# Patient Record
Sex: Female | Born: 1970 | Race: Black or African American | Hispanic: No | Marital: Single | Smoking: Current every day smoker
Health system: Southern US, Community
[De-identification: ages and names within clinical notes are randomized; demographics above are authoritative.]

## PROBLEM LIST (undated history)

## (undated) DIAGNOSIS — D649 Anemia, unspecified: Secondary | ICD-10-CM

## (undated) DIAGNOSIS — B1920 Unspecified viral hepatitis C without hepatic coma: Secondary | ICD-10-CM

## (undated) DIAGNOSIS — L989 Disorder of the skin and subcutaneous tissue, unspecified: Secondary | ICD-10-CM

## (undated) DIAGNOSIS — A599 Trichomoniasis, unspecified: Secondary | ICD-10-CM

## (undated) DIAGNOSIS — N289 Disorder of kidney and ureter, unspecified: Secondary | ICD-10-CM

## (undated) DIAGNOSIS — N750 Cyst of Bartholin's gland: Secondary | ICD-10-CM

## (undated) DIAGNOSIS — K759 Inflammatory liver disease, unspecified: Secondary | ICD-10-CM

## (undated) HISTORY — PX: TUBAL LIGATION: SHX77

---

## 1998-01-19 ENCOUNTER — Inpatient Hospital Stay (HOSPITAL_COMMUNITY): Admission: AD | Admit: 1998-01-19 | Discharge: 1998-01-19 | Payer: Self-pay | Admitting: Obstetrics

## 1998-08-22 ENCOUNTER — Inpatient Hospital Stay (HOSPITAL_COMMUNITY): Admission: AD | Admit: 1998-08-22 | Discharge: 1998-08-25 | Payer: Self-pay | Admitting: *Deleted

## 1998-09-15 ENCOUNTER — Emergency Department (HOSPITAL_COMMUNITY): Admission: EM | Admit: 1998-09-15 | Discharge: 1998-09-15 | Payer: Self-pay | Admitting: Internal Medicine

## 1999-08-02 ENCOUNTER — Emergency Department (HOSPITAL_COMMUNITY): Admission: EM | Admit: 1999-08-02 | Discharge: 1999-08-03 | Payer: Self-pay | Admitting: Emergency Medicine

## 2000-05-19 ENCOUNTER — Emergency Department (HOSPITAL_COMMUNITY): Admission: EM | Admit: 2000-05-19 | Discharge: 2000-05-19 | Payer: Self-pay | Admitting: Internal Medicine

## 2001-07-13 ENCOUNTER — Emergency Department (HOSPITAL_COMMUNITY): Admission: EM | Admit: 2001-07-13 | Discharge: 2001-07-14 | Payer: Self-pay

## 2002-06-23 ENCOUNTER — Inpatient Hospital Stay (HOSPITAL_COMMUNITY): Admission: AD | Admit: 2002-06-23 | Discharge: 2002-06-26 | Payer: Self-pay | Admitting: Obstetrics

## 2003-03-13 ENCOUNTER — Emergency Department (HOSPITAL_COMMUNITY): Admission: EM | Admit: 2003-03-13 | Discharge: 2003-03-14 | Payer: Self-pay | Admitting: Emergency Medicine

## 2003-03-14 ENCOUNTER — Encounter: Payer: Self-pay | Admitting: Emergency Medicine

## 2004-08-17 ENCOUNTER — Inpatient Hospital Stay (HOSPITAL_COMMUNITY): Admission: AD | Admit: 2004-08-17 | Discharge: 2004-08-17 | Payer: Self-pay | Admitting: *Deleted

## 2007-10-11 ENCOUNTER — Inpatient Hospital Stay (HOSPITAL_COMMUNITY): Admission: AD | Admit: 2007-10-11 | Discharge: 2007-10-11 | Payer: Self-pay | Admitting: Obstetrics & Gynecology

## 2008-02-29 ENCOUNTER — Ambulatory Visit: Payer: Self-pay | Admitting: Obstetrics and Gynecology

## 2008-02-29 ENCOUNTER — Encounter: Payer: Self-pay | Admitting: Obstetrics and Gynecology

## 2008-02-29 ENCOUNTER — Inpatient Hospital Stay (HOSPITAL_COMMUNITY): Admission: AD | Admit: 2008-02-29 | Discharge: 2008-03-03 | Payer: Self-pay | Admitting: Obstetrics and Gynecology

## 2010-11-30 NOTE — Op Note (Signed)
NAME:  Isabella Powell, Isabella Powell NO.:  [id number]   MEDICAL RECORD NO.:  [id number]          PATIENT TYPE:  INP   LOCATION:                                FACILITY:  [facility]   PHYSICIAN:  Tilda Burrow, M.D. DATE OF BIRTH:  1971-04-25   DATE OF PROCEDURE:  DATE OF DISCHARGE:                               OPERATIVE REPORT   PREOP:  Pregnancy 37 weeks, twin gestation, spontaneous rupture  membranes, early labor, intrauterine growth retardation Baby B, breech  presentation, baby B, elective permanent sterilization requested.   POSTOP:  Pregnancy 37 weeks, twin gestation, spontaneous rupture  membranes, early labor, intrauterine growth retardation Baby B, breech  presentation, baby B, also nuchal cord x1 baby B, elective permanent  sterilization requested.   PROCEDURE:  Primary low-transverse cervical cesarean section and  bilateral partial salpingectomy.   SURGEON:  Tilda Burrow, MD   ASSISTANT:  None.   ANESTHESIA:  Epidural, spinal unsuccessful, repeated by epidural.   COMPLICATIONS:  None.   FINDINGS:  A healthy female infant, Apgars __________.  Healthy female  infant baby B, Apgars so and so with nuchal cord x1.  Clear amniotic  fluid bilaterally.   INDICATIONS:  A 40 year old gravida 4, para 3-0-0-3 known twin A  gestation with ultrasound confirmed IUGR baby B presenting in early  labor with membrane rupture.  Ultrasound showing 29% growth of  discrepancy, breech baby B.  The patient presents and after discussion  of options, confirms both ambivalence regarding vaginal delivery and  desire for permanent sterilization and desire for expedited delivery.   DETAILS OF PROCEDURE:  The patient was taken operating to the room,  prepped and draped in the usual fashion for lower abdominal surgery.  After the epidural analgesic defect prior efforts of spinal then  incomplete, and so epidural was placed and used to achieve optimal  analgesia.  A Pfannenstiel incision  was repeated easily.  The patient  had minimal subcu fat.  Peritoneal cavity was entered in the midline.  Bladder flap developed on lower uterine segment.  Transverse uterine  incision performed at the level of the inferior aspects of the anterior  placenta of baby A and fetal vertex guided into the incision and  delivered by fundal pressure.  There was a generous amount of blood loss  associated with varicosities in the myometrium adjacent at the site of  the incision.  The baby was passed to the awaiting pediatrician, Dr.  Joana Reamer whose resuscitation involved is documented elsewhere.   Baby B could be found in the upper portion of the uterus and fetal  vertex was identified, guided into the incision, and delivered by vertex  presentation after membranes ruptured.  There was a nuchal cord x1 which  had reslipped over the head delivered the baby.  The placenta was then  delivered with no separate placentas noted.  The placenta of the Baby B  was high and the fundus placenta of baby A was anterior on the anterior  reaching down to the uterine incision.   The uterus was grasped with ring forceps in 4 sites, closed  with running  locking single-layer closure.  Bladder flap was approximated using 2-0  chromic running fashion.   Tubal ligation was performed by placing a Filshie clip on the mid  portion of each tube with satisfactory surgical positioning.   The patient again verbally confirmed desire for permanent sterilization,  both the start of the case and immediately before procedure was  performed.   The anterior peritoneum was closed using running 2-0 chromic.  The  fascia closed using running 0 Vicryl, subcu fatty tissues that were  minimal in thickness were loosely reapproximated with 3 interrupted  sutures of 3-0 and 2-0 plain and staple closure of skin resulted in good  cosmetic closure of the incision.  EBL was 1000 mL associated almost 6  once I believe with the initial  uterine incision.  The patient then went  to recovery room in stable condition.  Babies are in the nursery.  See  their notes noted elsewhere.      Tilda Burrow, M.D.  Electronically Signed     JVF/MEDQ  D:  02/29/2008  T:  03/01/2008  Job:  [id number]

## 2010-12-03 NOTE — Discharge Summary (Signed)
   NAME:  Isabella Powell, Isabella Powell                        ACCOUNT NO.:  [id number]   MEDICAL RECORD NO.:  [id number]                   PATIENT TYPE:  INP   LOCATION:  9133                                 FACILITY:  [facility]   PHYSICIAN:  Kathreen Cosier, M.D.           DATE OF BIRTH:  1970-11-04   DATE OF ADMISSION:  06/23/2002  DATE OF DISCHARGE:  06/26/2002                                 DISCHARGE SUMMARY   HISTORY OF PRESENT ILLNESS:  The patient is a 40 year old Gravida V, Para  III, 0/2/3 who's last menstrual period was June 03, 2002. She was  admitted because of lower abdominal pain with right labia that was swollen  since her pregnancy in 2000. On examination, her temperature was 100.2. On  examination had bilateral lower abdominal tenderness with no rebound. She  was admitted and placed on IV Rocephin and her hemoglobin was 12.9. White  count 11.2. Platelets 238,000. Urine pregnancy test was negative. Urinalysis  showed moderate leukocyte esterase. Her GC was positive. She rapidly  defervesced and by June 26, 2002 after being treated with Rocephin  daily, she was afebrile for greater than 24 hours. Abdomen was soft and she  was discharged home on Ampicillin 500 mg by mouth every six hours. She is to  see me in two weeks.   DISCHARGE DIAGNOSES:  Status post hospitalization for gonorrhea.                                               Kathreen Cosier, M.D.    BAM/MEDQ  D:  08/15/2002  T:  08/15/2002  Job:  [id number]

## 2010-12-03 NOTE — Discharge Summary (Signed)
Isabella Powell, Isabella Powell              ACCOUNT NO.:  [id number]   MEDICAL RECORD NO.:  [id number]          PATIENT TYPE:  INP   LOCATION:  9134                          [facility]ACILITY:  [facility]   PHYSICIAN:  Tilda Burrow, M.D. DATE OF BIRTH:  03/30/1971   DATE OF ADMISSION:  02/29/2008  DATE OF DISCHARGE:  03/03/2008                               DISCHARGE SUMMARY   ADMITTING DIAGNOSES:  1. Pregnancy, 37 weeks 4 days.  2. Premature rupture membranes without labor.  3. Twin gestation.  4. Cocaine abuse.  5. Discordant growth of twins, breech baby B.   DISCHARGE DIAGNOSES:  1. Pregnancy at 37 weeks, delivered.  2. Twin gestation.  3. Cocaine abuse.   HISTORY OF PRESENT ILLNESS:  This 40 year old gravida 4, para 3-0-0-3  with twins of 37 weeks with no prenatal care over the last 2 months with  acknowledged cocaine use is admitted after pregnancy course followed as  much as the patient would keep her appointments through the [facility]  Cl[facility] here at [facility].  She presents on the  evening of February 29, 2008, with membrane rupture.  She was counseled  over the possibility of vaginal delivery.  The review of record shows  29% growth discordance twin baby A and B.  The patient also desires  tubal ligation, cervix of 3 cm,  20%, -2 posterior vertex presentation  of baby A.  After discussion of risks, benefits and counseling, the  patient desires to proceed with cesarean section and simultaneous tubal  ligation.   HOSPITAL COURSE:  The patient underwent requested C-section tubal  ligation, delivering a 5-pound 10-ounce (57 female baby A) and a 1827  grams (4-pound 1-ounce) baby B female.  Postoperatively, the patient did  well.  Her urine drug screen returned positive for cocaine even though  the patient has always denied use in the recent past.  Social work  consult was performed and Child Protective Case was initiated.  Postoperatively, the patient had stable vital signs  with blood pressures  114/77, pulse 80, and respirations 20.  She was counseled by social work  for whom the patient claimed that she did not have an ongoing substance  abuse problem and does not need any resources for discontinuing use of  cocaine.  She allegedly still claims not to use cocaine during the  pregnancy, but is due to other people in her home using the drug by  smoking it.  The patient nonetheless discharged home in stable  condition, _patient and family___ showing interest in the babies with  routine postsurgical instructions review.      Tilda Burrow, M.D.  Electronically Signed     JVF/MEDQ  D:  03/30/2008  T:  03/31/2008  Job:  [id number]

## 2010-12-03 NOTE — Discharge Summary (Signed)
NAMEMELYSA, Isabella Powell              ACCOUNT NO.:  [id number]   MEDICAL RECORD NO.:  [id number]          PATIENT TYPE:  INP   LOCATION:  9134                          [facility]ACILITY:  [facility]   PHYSICIAN:  Tanya S. Shawnie Pons, M.D.   DATE OF BIRTH:  March 20, 1971   DATE OF ADMISSION:  02/29/2008  DATE OF DISCHARGE:  03/03/2008                               DISCHARGE SUMMARY   ADMITTING DIAGNOSES:  84. A 40 year old female G7, P3-0-3-3 at 55 and 4 weeks' gestation with      spontaneous rupture of membranes.  2. Twin gestation with intrauterine growth retardation of baby B, and      breech presentation of baby B.  3. Elective permanent sterilization requested.   DISCHARGE DIAGNOSES:  55. A 40 year old female G7, P3-0-3-3 at 65 and 4 weeks' gestation with      spontaneous rupture of membranes.  2. Primary low transverse cesarean section with resulting twin      deliveries.  3. Bilateral partial salpingectomy.   DISCHARGE MEDICATIONS:  1. Percocet 5/325, take 1-2 every 6 hours as needed for pain.  2. Ibuprofen 600 mg, take every 6 hours as needed for pain.  3. Colace 100 mg, take b.i.d. for constipation.  4. Continue prenatal vitamins.   LABORATORY DATA:  White blood cell count 6.7, hemoglobin 11.7,  hematocrit 33.7, platelets 334.  RPR is nonreactive.  Rubella was  immune.  Hep B surface antigen negative.   BRIEF HOSPITAL COURSE:  Please see operative report of February 29, 2008,  dictated by Dr. Emelda Fear.   DISCHARGE INSTRUCTIONS:  The patient was given routine postpartum  discharge instructions and medications, and she was advised to follow up  with her primary OB in 4-6 weeks for regular postpartum check.      Helane Rima, MD  Electronically Signed     ______________________________  Shelbie Proctor. Shawnie Pons, M.D.    EW/MEDQ  D:  03/29/2008  T:  03/29/2008  Job:  [id number]

## 2011-04-11 LAB — RAPID URINE DRUG SCREEN, HOSP PERFORMED
Barbiturates: NOT DETECTED
Benzodiazepines: NOT DETECTED
Cocaine: POSITIVE — AB
Opiates: NOT DETECTED

## 2011-04-11 LAB — URINALYSIS, ROUTINE W REFLEX MICROSCOPIC
Glucose, UA: NEGATIVE
Protein, ur: NEGATIVE
Specific Gravity, Urine: >1.03 — ABNORMAL HIGH
pH: 5.5

## 2011-04-11 LAB — DIFFERENTIAL
Basophils Relative: 0
Eosinophils Absolute: 0.2
Lymphs Abs: 1.3
Neutrophils Relative %: 67

## 2011-04-11 LAB — RPR: RPR Ser Ql: NONREACTIVE

## 2011-04-11 LAB — HEPATITIS B SURFACE ANTIGEN: Hepatitis B Surface Ag: NEGATIVE

## 2011-04-11 LAB — CBC
MCV: 99.1
Platelets: 331
WBC: 7.4

## 2011-04-11 LAB — GC/CHLAMYDIA PROBE AMP, GENITAL: GC Probe Amp, Genital: NEGATIVE

## 2011-04-11 LAB — URINE CULTURE

## 2011-04-11 LAB — TYPE AND SCREEN
ABO/RH(D): O POS
Antibody Screen: NEGATIVE

## 2011-04-15 LAB — RAPID URINE DRUG SCREEN, HOSP PERFORMED
Amphetamines: NOT DETECTED
Barbiturates: NOT DETECTED
Tetrahydrocannabinol: NOT DETECTED

## 2011-04-15 LAB — RUBELLA SCREEN: Rubella: 130.5 — ABNORMAL HIGH

## 2011-04-15 LAB — CBC
Hemoglobin: 11.7 — ABNORMAL LOW
RBC: 3.14 — ABNORMAL LOW

## 2011-04-15 LAB — TYPE AND SCREEN: Antibody Screen: NEGATIVE

## 2011-04-15 LAB — DIFFERENTIAL
Lymphocytes Relative: 22
Lymphs Abs: 1.5
Monocytes Absolute: 0.8
Monocytes Relative: 12
Neutro Abs: 4.3

## 2011-04-15 LAB — RPR: RPR Ser Ql: NONREACTIVE

## 2012-06-26 ENCOUNTER — Inpatient Hospital Stay (HOSPITAL_COMMUNITY)
Admission: AD | Admit: 2012-06-26 | Discharge: 2012-06-26 | Disposition: A | Discharge status: Home / Self Care | Payer: Self-pay | Source: Ambulatory Visit | Attending: Family Medicine | Admitting: Family Medicine

## 2012-06-26 ENCOUNTER — Encounter (HOSPITAL_COMMUNITY): Payer: Self-pay

## 2012-06-26 DIAGNOSIS — N750 Cyst of Bartholin's gland: Secondary | ICD-10-CM | POA: Insufficient documentation

## 2012-06-26 DIAGNOSIS — N949 Unspecified condition associated with female genital organs and menstrual cycle: Secondary | ICD-10-CM | POA: Insufficient documentation

## 2012-06-26 HISTORY — DX: Disorder of the skin and subcutaneous tissue, unspecified: L98.9

## 2012-06-26 HISTORY — DX: Cyst of Bartholin's gland: N75.0

## 2012-06-26 HISTORY — DX: Trichomoniasis, unspecified: A59.9

## 2012-06-26 MED ORDER — OXYCODONE-ACETAMINOPHEN 5-325 MG PO TABS
2.0000 | ORAL_TABLET | Freq: Once | ORAL | Status: AC
  Administered 2012-06-26: 2 via ORAL
  Filled 2012-06-26: qty 2

## 2012-06-26 MED ORDER — OXYCODONE-ACETAMINOPHEN 5-325 MG PO TABS: 1.0000 | ORAL_TABLET | Freq: Four times a day (QID) | ORAL | Status: DC | PRN

## 2012-06-26 MED ORDER — ONDANSETRON 8 MG PO TBDP
8.0000 mg | ORAL_TABLET | Freq: Once | ORAL | Status: AC
  Administered 2012-06-26: 8 mg via ORAL
  Filled 2012-06-26: qty 1

## 2012-06-26 MED ORDER — HYDROMORPHONE HCL PF 1 MG/ML IJ SOLN
1.0000 mg | Freq: Once | INTRAMUSCULAR | Status: AC
  Administered 2012-06-26: 1 mg via INTRAMUSCULAR
  Filled 2012-06-26: qty 1

## 2012-06-26 NOTE — MAU Note (Signed)
Patient is in with c/o intense pain that she thinks its bartholian cyst. She states that she had it lanced about 3 years ago. She states that the pain started 3 days ago but today the pain is intense (throbbing pain with any movement).

## 2012-06-26 NOTE — ED Provider Notes (Signed)
History     CSN: [id number]  Arrival date and time: 06/26/12 1017   First Provider Initiated Contact with Patient 06/26/12 1158      Chief Complaint  Patient presents with  . Bartholin's Cyst   HPI   Ms. Isabella Powell is a 41 y.o. [id number] who presents today in a significant amount of pain. The pain has been present for 3 days. She describes the pain as the worst she has ever felt. This pain is similar to her previous bartholin's cysts. She has had one previous cyst that required drainage. She has also had numerous that will come and go spontaneously, the last of which occurred in August of this year. She soaked in a warm bath last night and felt that the pain was worse afterwards. She has been taking her boyfriend's percocet for her pain the last few days with some relief. She denies abdominal or pelvic pain, VB or discharge. She denies recent fever.   OB History    Grav Para Term Preterm Abortions TAB SAB Ect Mult Living   7 5 4 1 2 1 1   5       Past Medical History  Diagnosis Date  . Bartholin cyst     Past Surgical History  Procedure Date  . Cesarean section   . Tubal ligation     bilateral    History reviewed. No pertinent family history.  History  Substance Use Topics  . Smoking status: Current Every Day Smoker    Types: Cigarettes  . Smokeless tobacco: Not on file  . Alcohol Use: Yes    Allergies: No Known Allergies  No prescriptions prior to admission    ROS  All ROS negative unless otherwise stated in HPI  Physical Exam   Blood pressure 108/69, pulse 78, temperature 98.9 F (37.2 C), temperature source Oral, resp. rate 18, last menstrual period 06/20/2012, SpO2 98.00%.  Physical Exam  Constitutional: She is oriented to person, place, and time. She appears well-developed and well-nourished. No distress.  HENT:  Head: Normocephalic.  Cardiovascular: Normal rate.   Respiratory: Effort normal.  GI: Soft. She exhibits no distension. There is no  tenderness.  Genitourinary: There is tenderness and lesion (significantly inflammed batholin's cyst) on the right labia.  Neurological: She is alert and oriented to person, place, and time.  Skin: Skin is warm and dry. 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 No erythema.  Psychiatric: She has a normal mood and affect.    MAU Course  Procedures  Bartholin Cyst I&D and Ward Catheter Placement Enlarged abscess palpated in front of the hymenal ring around 7 o' clock.  Written informed consent was obtained.  Discussed complications and possible outcomes of procedure including recurrence of cyst, scarring leading to infecton, bleeding, dyspareunia, distortion of anatomy.  Patient was examined in the dorsal lithotomy position and mass was identified.  The area was prepped with Iodine and draped in a sterile manner. 1% Lidocaine (3 ml) was then used to infiltrate area on top of the cyst, behind the hymenal ring.  A 7 mm incision was made using a sterile scapel. Upon palpation of the mass, a copious amount of bloody purulent and odorless drainage was expressed through the incision. Samples of the drainage were sent for cultures. A hemostat was used to break up loculations, which resulted in expression of a small amount of additional bloody drainage.  The open cyst was then irrigated with normal saline and soon after was no longer draining. A 3cm x  3cm firm mass was still palpable following the procedure. A Word catheter was placed. 1.5 ml of sterile water was used to inflate the catheter balloon.  The end of the catheter was tucked into the vagina.  Patient tolerated the procedure well, reported feeling " a lot better." - Percocet was given prn pain.   She was told to call to be examined if she experiences increasing swelling, pain, vaginal discharge, or fever.  - She was instructed to wear a peripad to absorb discharge, and to maintain pelvic rest while the Word catheter is in place.  -The catheter will be left in place  at least until follow-up in GYN clinic later this week to promote formation of an epithelialized tract for permanent drainage of glandular secretions.  - She will need follow-up in the [facility] GYN clinic preferably by the end of the week to re-evaluate the bartholin's cyst.     MDM Patient given Percocet for pain while in MAU. Unable to do exam prior to pain medication administration.  Assessment and Plan  A: Drainage of large Bartholin's cyst vs. abscess with persistent firm mass. Possible biopsy recommended in the future if the mass does not resolve in the next 2-3 weeks.   P: I&D of Bartholin's cyst in MAU today. Word catheter placed. Rx given for Percocet Patient will be called from clinic to follow-up by the end of the week for re-evaluation of bartholin's cyst and removal of word catheter Patient told to return to MAU if her condition were to change or worsen or if she were to develop a fever greater than 100.81F   Freddi Starr, PA-C 06/26/2012 4:54 PM    Clemetine Marker C  Home Medication Instructions [id number]   Printed on:06/26/12 1631  Medication Information                    oxyCODONE-acetaminophen (ROXICET) 5-325 MG per tablet Take 1-2 tablets by mouth every 6 (six) hours as needed for pain.             Freddi Starr, PA-C 06/26/12 1949

## 2012-06-26 NOTE — ED Provider Notes (Signed)
Chart reviewed and agree with management and plan.  

## 2012-06-26 NOTE — MAU Note (Signed)
Patient states she has a history of bartholin cyst. States she started having one on the right side about 3 days ago. Extremely painful. Denies vaginal bleeding or discharge.

## 2012-06-28 ENCOUNTER — Encounter: Payer: Self-pay | Admitting: Obstetrics & Gynecology

## 2012-06-29 LAB — WOUND CULTURE: Culture: NO GROWTH

## 2013-01-12 ENCOUNTER — Encounter (HOSPITAL_COMMUNITY): Payer: Self-pay | Admitting: Emergency Medicine

## 2013-01-12 ENCOUNTER — Emergency Department (INDEPENDENT_AMBULATORY_CARE_PROVIDER_SITE_OTHER)
Admission: EM | Admit: 2013-01-12 | Discharge: 2013-01-12 | Disposition: A | Discharge status: Home / Self Care | Payer: Self-pay | Source: Home / Self Care

## 2013-01-12 DIAGNOSIS — K0500 Acute gingivitis, plaque induced: Secondary | ICD-10-CM

## 2013-01-12 DIAGNOSIS — R221 Localized swelling, mass and lump, neck: Secondary | ICD-10-CM

## 2013-01-12 DIAGNOSIS — K029 Dental caries, unspecified: Secondary | ICD-10-CM

## 2013-01-12 DIAGNOSIS — R220 Localized swelling, mass and lump, head: Secondary | ICD-10-CM

## 2013-01-12 DIAGNOSIS — K089 Disorder of teeth and supporting structures, unspecified: Secondary | ICD-10-CM

## 2013-01-12 DIAGNOSIS — K0889 Other specified disorders of teeth and supporting structures: Secondary | ICD-10-CM

## 2013-01-12 MED ORDER — AMOXICILLIN 500 MG PO CAPS: ORAL_CAPSULE | ORAL | Status: DC

## 2013-01-12 MED ORDER — OXYCODONE-ACETAMINOPHEN 5-325 MG PO TABS: 1.0000 | ORAL_TABLET | ORAL | Status: DC | PRN

## 2013-01-12 MED ORDER — KETOROLAC TROMETHAMINE 30 MG/ML IJ SOLN
INTRAMUSCULAR | Status: AC
  Filled 2013-01-12: qty 1

## 2013-01-12 MED ORDER — KETOROLAC TROMETHAMINE 30 MG/ML IJ SOLN
30.0000 mg | Freq: Once | INTRAMUSCULAR | Status: AC
  Administered 2013-01-12: 30 mg via INTRAMUSCULAR

## 2013-01-12 NOTE — ED Notes (Signed)
Pt c/o dental pain/abscess onset 3 days... sxs include: Left side of face, bottom, swollen, fevers, v/n and left ear pain... Taking ibup w/no relief... She is alert w/no signs of acute distress.

## 2013-01-12 NOTE — ED Provider Notes (Signed)
Medical screening examination/treatment/procedure(s) were performed by non-physician practitioner and as supervising physician I was immediately available for consultation/collaboration.  Leslee Home, M.D.  Reuben Likes, MD 01/12/13 [phone]

## 2013-01-12 NOTE — ED Provider Notes (Signed)
History    CSN: [id number] Arrival date & time 01/12/13  1524  First MD Initiated Contact with Patient 01/12/13 1700     Chief Complaint  Patient presents with  . Dental Pain   (Consider location/radiation/quality/duration/timing/severity/associated sxs/prior Treatment) HPI Comments: 42 year old female presents with left facial pain for 2-3 days. She is unsure she actually has a toothache. She stated that 3 days ago she had temperature of 103. Denies sore throat but does state that she is beginning to get an earache on the left side.  Past Medical History  Diagnosis Date  . Bartholin cyst   . Psoriasis-like skin disease   . Trichomonas     patient states that many years ago   Past Surgical History  Procedure Laterality Date  . Cesarean section    . Tubal ligation      bilateral   No family history on file. History  Substance Use Topics  . Smoking status: Current Every Day Smoker    Types: Cigarettes  . Smokeless tobacco: Not on file  . Alcohol Use: Yes   OB History   Grav Para Term Preterm Abortions TAB SAB Ect Mult Living   7 5 4 1 2 1 1   5      Review of Systems  Constitutional: Positive for fever and activity change.  HENT: Positive for facial swelling. Negative for congestion, rhinorrhea, mouth sores, neck pain, neck stiffness and ear discharge.   Respiratory: Negative.   Gastrointestinal: Positive for nausea.  Skin: Negative.     Allergies  Review of patient's allergies indicates no known allergies.  Home Medications   Current Outpatient Rx  Name  Route  Sig  Dispense  Refill  . amoxicillin (AMOXIL) 500 MG capsule      1000 mg BID for 7 days   28 capsule   0   . oxyCODONE-acetaminophen (PERCOCET/ROXICET) 5-325 MG per tablet   Oral   Take 1 tablet by mouth every 4 (four) hours as needed for pain.   15 tablet   0   . oxyCODONE-acetaminophen (ROXICET) 5-325 MG per tablet   Oral   Take 1-2 tablets by mouth every 6 (six) hours as needed for  pain.   30 tablet   0    BP 111/61  Pulse 63  Temp(Src) 98.5 F (36.9 C) (Oral)  Resp 17  SpO2 100%  LMP 01/12/2013 Physical Exam  Vitals reviewed. Constitutional: She is oriented to person, place, and time. She appears well-developed and well-nourished.  HENT:  Bilateral TMs are normal. Oropharynx with minor erythema and PND. Some darkening of the tongue. "She smokes " Very poor dentition. There are large craters and multiple caries to the left lower molars. Many teeth are worn down to the none the level of the gingiva. There is erythema and swelling to the left lower gingiva. There is also swelling opposite the left mandible.  Neck: Normal range of motion. Neck supple.  Cardiovascular: Normal rate.   Pulmonary/Chest: Effort normal.  Lymphadenopathy:    She has no cervical adenopathy.  Neurological: She is alert and oriented to person, place, and time. She exhibits normal muscle tone.  Skin: Skin is warm and dry.  Psychiatric: She has a normal mood and affect.    ED Course  Procedures (including critical care time) Labs Reviewed - No data to display No results found. 1. Toothache   2. Caries involving multiple surfaces of tooth   3. Left facial swelling   4. Acute gingivitis  MDM  Amoxicillin 1000 mg twice a day for 7 days Toradol 30 mg IM now Percocet 5 mg one every 4 hours when necessary pain #15 Apply heat to the area of swelling and pain See her dentist on Monday as planned.  Hayden Rasmussen, NP 01/12/13 1733

## 2013-05-21 ENCOUNTER — Encounter: Payer: Self-pay | Admitting: Obstetrics

## 2013-05-21 ENCOUNTER — Ambulatory Visit: Payer: Self-pay | Admitting: Obstetrics

## 2013-06-04 ENCOUNTER — Ambulatory Visit: Payer: Self-pay | Admitting: Obstetrics

## 2013-06-17 ENCOUNTER — Ambulatory Visit: Payer: Medicaid Other | Admitting: Obstetrics

## 2013-09-30 ENCOUNTER — Ambulatory Visit: Payer: Medicaid Other | Admitting: Obstetrics

## 2013-10-09 ENCOUNTER — Ambulatory Visit: Payer: Medicaid Other | Admitting: Obstetrics

## 2013-11-14 ENCOUNTER — Ambulatory Visit: Payer: Medicaid Other | Admitting: Obstetrics

## 2013-11-15 ENCOUNTER — Ambulatory Visit: Payer: Medicaid Other | Admitting: Obstetrics

## 2013-11-20 ENCOUNTER — Ambulatory Visit: Payer: Medicaid Other | Admitting: Obstetrics

## 2014-02-03 ENCOUNTER — Ambulatory Visit: Payer: Medicaid Other | Admitting: Obstetrics

## 2014-02-04 ENCOUNTER — Ambulatory Visit: Payer: Medicaid Other | Admitting: Obstetrics

## 2014-05-02 ENCOUNTER — Inpatient Hospital Stay (HOSPITAL_COMMUNITY)
Admission: AD | Admit: 2014-05-02 | Discharge: 2014-05-02 | Disposition: A | Discharge status: Home / Self Care | Payer: Medicaid Other | Source: Ambulatory Visit | Attending: Family Medicine | Admitting: Family Medicine

## 2014-05-02 ENCOUNTER — Encounter (HOSPITAL_COMMUNITY): Payer: Self-pay | Admitting: *Deleted

## 2014-05-02 DIAGNOSIS — F17210 Nicotine dependence, cigarettes, uncomplicated: Secondary | ICD-10-CM | POA: Diagnosis not present

## 2014-05-02 DIAGNOSIS — N750 Cyst of Bartholin's gland: Secondary | ICD-10-CM | POA: Diagnosis not present

## 2014-05-02 LAB — POCT PREGNANCY, URINE: PREG TEST UR: NEGATIVE

## 2014-05-02 MED ORDER — LIDOCAINE HCL 2 % EX GEL
1.0000 "application " | Freq: Once | CUTANEOUS | Status: AC
  Administered 2014-05-02: 1 via TOPICAL
  Filled 2014-05-02: qty 5

## 2014-05-02 MED ORDER — OXYCODONE-ACETAMINOPHEN 5-325 MG PO TABS: 1.0000 | ORAL_TABLET | ORAL | Status: DC | PRN

## 2014-05-02 MED ORDER — OXYCODONE-ACETAMINOPHEN 5-325 MG PO TABS
1.0000 | ORAL_TABLET | Freq: Once | ORAL | Status: AC
Start: 2014-05-02 — End: 2014-05-02
  Administered 2014-05-02: 1 via ORAL
  Filled 2014-05-02: qty 1

## 2014-05-02 MED ORDER — LIDOCAINE HCL (PF) 1 % IJ SOLN
30.0000 mL | Freq: Once | INTRAMUSCULAR | Status: AC
  Administered 2014-05-02: 30 mL via INTRADERMAL

## 2014-05-02 NOTE — Discharge Instructions (Signed)
Bartholin's Cyst or Abscess °Bartholin's glands are small glands located within the folds of skin (labia) along the sides of the lower opening of the vagina (birth canal). A cyst may develop when the duct of the gland becomes blocked. When this happens, fluid that accumulates within the cyst can become infected. This is known as an abscess. The Bartholin gland produces a mucous fluid to lubricate the outside of the vagina during sexual intercourse. °SYMPTOMS  °· Patients with a small cyst may not have any symptoms. °· Mild discomfort to severe pain depending on the size of the cyst and if it is infected (abscess). °· Pain, redness, and swelling around the lower opening of the vagina. °· Painful intercourse. °· Pressure in the perineal area. °· Swelling of the lips of the vagina (labia). °· The cyst or abscess can be on one side or both sides of the vagina. °DIAGNOSIS  °· A large swelling is seen in the lower vagina area by your caregiver. °· Painful to touch. °· Redness and pain, if it is an abscess. °TREATMENT  °· Sometimes the cyst will go away on its own. °· Apply warm wet compresses to the area or take hot sitz baths several times a day. °· An incision to drain the cyst or abscess with local anesthesia. °· Culture the pus, if it is an abscess. °· Antibiotic treatment, if it is an abscess. °· Cut open the gland and suture the edges to make the opening of the gland bigger (marsupialization). °· Remove the whole gland if the cyst or abscess returns. °PREVENTION  °· Practice good hygiene. °· Clean the vaginal area with a mild soap and soft cloth when bathing. °· Do not rub hard in the vaginal area when bathing. °· Protect the crotch area with a padded cushion if you take long bike rides or ride horses. °· Be sure you are well lubricated when you have sexual intercourse. °HOME CARE INSTRUCTIONS  °· If your cyst or abscess was opened, a small piece of gauze, or a drain, may have been placed in the wound to allow  drainage. Do not remove this gauze or drain unless directed by your caregiver. °· Wear feminine pads, not tampons, as needed for any drainage or bleeding. °· If antibiotics were prescribed, take them exactly as directed. Finish the entire course. °· Only take over-the-counter or prescription medicines for pain, discomfort, or fever as directed by your caregiver. °SEEK IMMEDIATE MEDICAL CARE IF:  °· You have an increase in pain, redness, swelling, or drainage. °· You have bleeding from the wound which results in the use of more than the number of pads suggested by your caregiver in 24 hours. °· You have chills. °· You have a fever. °· You develop any new problems (symptoms) or aggravation of your existing condition. °MAKE SURE YOU:  °· Understand these instructions. °· Will watch your condition. °· Will get help right away if you are not doing well or get worse. °Document Released: 07/04/2005 Document Revised: 09/26/2011 Document Reviewed: 02/20/2008 °[employer]® Patient Information ©2015 [employer]. This information is not intended to replace advice given to you by your health care provider. Make sure you discuss any questions you have with your health care provider. ° °

## 2014-05-02 NOTE — MAU Provider Note (Signed)
History     CSN: [id number]  Arrival date and time: 05/02/14 1601   None     Chief Complaint  Patient presents with  . Bartholin's Cyst   HPI  Ms. Isabella Powell is a 43 y.o.female who presents with a bartholin's Cyst abscess  that she has had for over a month. It started draining on its own initially, however has grown bigger over the last few days.  She does not have a regular dr. Claiborne Powell she see's. It has become difficult for the patient to get comfortable due to the pain the cyst is causing.     OB History   Grav Para Term Preterm Abortions TAB SAB Ect Mult Living   7 5 4 1 2 1 1   5       Past Medical History  Diagnosis Date  . Bartholin cyst   . Psoriasis-like skin disease   . Trichomonas     patient states that many years ago    Past Surgical History  Procedure Laterality Date  . Cesarean section    . Tubal ligation      bilateral    No family history on file.  History  Substance Use Topics  . Smoking status: Current Every Day Smoker -- 0.50 packs/day    Types: Cigarettes  . Smokeless tobacco: Never Used  . Alcohol Use: Yes     Comment: occassionally    Allergies: No Known Allergies  Prescriptions prior to admission  Medication Sig Dispense Refill  . amoxicillin (AMOXIL) 500 MG capsule 1000 mg BID for 7 days  28 capsule  0  . oxyCODONE-acetaminophen (PERCOCET/ROXICET) 5-325 MG per tablet Take 1 tablet by mouth every 4 (four) hours as needed for pain.  15 tablet  0  . oxyCODONE-acetaminophen (ROXICET) 5-325 MG per tablet Take 1-2 tablets by mouth every 6 (six) hours as needed for pain.  30 tablet  0   Results for orders placed during the hospital encounter of 05/02/14 (from the past 48 hour(s))  POCT PREGNANCY, URINE     Status: None   Collection Time    05/02/14  8:52 PM      Result Value Ref Range   Preg Test, Ur NEGATIVE  NEGATIVE   Comment:            THE SENSITIVITY OF THIS     METHODOLOGY IS >24 mIU/mL    Review of Systems   Constitutional: Negative for fever and chills.  Genitourinary: Negative for dysuria, urgency, frequency and hematuria.   Physical Exam   Blood pressure 113/56, pulse 85, temperature 99.5 F (37.5 C), resp. rate 16, height 5' 7.5" (1.715 m), weight 53.978 kg (119 lb), last menstrual period 04/02/2014, SpO2 100.00%.  Physical Exam  Constitutional: She is oriented to person, place, and time. She appears well-developed and well-nourished. No distress.  HENT:  Head: Normocephalic.  Eyes: Pupils are equal, round, and reactive to light.  Neck: Neck supple.  Respiratory: Effort normal.  GI: Soft.  Genitourinary:  Golf ball size bartholins gland abscess on right labia; tender to touch; fluctuant   Musculoskeletal: Normal range of motion.  Neurological: She is alert and oriented to person, place, and time.  Skin: Skin is warm. She is not diaphoretic.  Psychiatric: Her behavior is normal.    MAU Course  Procedures  Consent form signed. Time out.   Patient positioned and draped with sterile towels.  Preoperative medication: Lidocaine gel applied to the area. Percocet 1 tablet po  Area cleaned with betadine Local infiltrate with lidocaine 1%. Amount 3 ccs  I&D Scalpel size: #11blade Incision type: Straight single Complexity: Complex Drained copious amount of purulent drainage Probed with curved hemostat to break up loculations  Wound treatment: Word Catheter placed and inflated with 3 cc. Of NS Packing: none Patient tolerance: Tolerated procedure well.  - Recommended Sitz baths bid and Motrin and Percocet was given  prn pain.    -She was told to call to be examined if she experiences increasing swelling, pain, vaginal discharge, or fever.  - She was instructed to wear a peripad to absorb discharge, and to maintain pelvic rest while the Word catheter is   in place.  -The catheter will be left in place for at least two to four weeks to promote formation of an epithelialized  tract for permanent drainage of glandular secretions.  - She will need an appointment [facility] from now for removal of word catheter.     MDM Dr. Adrian BlackwaterStinson consulted to assess cyst due to the size prior to I and D.    Assessment and Plan   A:  1. Bartholin gland cyst    P:  Discharge home in stable condition RX: Percocet #10 no refill  Follow up in the [facility] in 4-6 weeks to have word catheter removed Return to MAU with increased pain or fever/chills    Iona HansenJennifer Irene Deshon Koslowski, NP 05/02/2014 10:36 PM

## 2014-05-02 NOTE — Progress Notes (Signed)
No s/s of adverse reaction to pain medication noted. Patient states pain is now a 2of10.

## 2014-05-02 NOTE — MAU Note (Signed)
Patient states she has a history of bartholin cyst. States she has had one on the right labia for about one year that is getting bigger and more painful.

## 2014-05-03 NOTE — MAU Provider Note (Signed)
Attestation of Attending Supervision of Advanced Practitioner (PA/CNM/NP): Evaluation and management procedures were performed by the Advanced Practitioner under my supervision and collaboration.  I have reviewed the Advanced Practitioner's note and chart, and I agree with the management and plan.  Jacob Stinson, DO Attending Physician Faculty Practice, [facility]  

## 2014-05-05 ENCOUNTER — Encounter: Payer: Self-pay | Admitting: Obstetrics and Gynecology

## 2014-05-19 ENCOUNTER — Encounter (HOSPITAL_COMMUNITY): Payer: Self-pay | Admitting: *Deleted

## 2014-06-18 ENCOUNTER — Encounter: Payer: Self-pay | Admitting: Obstetrics and Gynecology

## 2014-06-18 ENCOUNTER — Ambulatory Visit (INDEPENDENT_AMBULATORY_CARE_PROVIDER_SITE_OTHER): Payer: Medicaid Other | Admitting: Obstetrics and Gynecology

## 2014-06-18 VITALS — BP 113/64 | HR 66 | Temp 98.2°F | Ht 67.0 in | Wt 116.5 lb

## 2014-06-18 DIAGNOSIS — N750 Cyst of Bartholin's gland: Secondary | ICD-10-CM

## 2014-06-18 DIAGNOSIS — Z139 Encounter for screening, unspecified: Secondary | ICD-10-CM

## 2014-06-18 DIAGNOSIS — L409 Psoriasis, unspecified: Secondary | ICD-10-CM

## 2014-06-18 NOTE — Progress Notes (Signed)
Patient ID: Isabella Powell, female   DOB: June 29, 1971, 43 y.o.   MRN: [id number] 43 yo here for MAU follow up for removal of Ward catheter. Patient has had a right bartholin abscess diagnosed on 05/02/2014. She states that the ward catheter fell out yesterday. She is still having some light bloody drainage. She denies any pain  Past Medical History  Diagnosis Date  . Bartholin cyst   . Psoriasis-like skin disease   . Trichomonas     patient states that many years ago   Past Surgical History  Procedure Laterality Date  . Cesarean section    . Tubal ligation      bilateral   No family history on file. History  Substance Use Topics  . Smoking status: Current Every Day Smoker -- 0.50 packs/day    Types: Cigarettes  . Smokeless tobacco: Never Used  . Alcohol Use: Yes     Comment: occassionally   GENERAL: Well-developed, well-nourished female in no acute distress.  ABDOMEN: Soft, nontender, nondistended. No organomegaly. PELVIC: Normal external female genitalia. Vagina is pink and rugated.  Normal discharge. Normal appearing cervix. Uterus is normal in size. No adnexal mass or tenderness.  Right labia with 0.5 cm opening with serous drainage, no purulent discharge EXTREMITIES: No cyanosis, clubbing, or edema, 2+ distal pulses.  A/P 43 yo here for follow up on right bartholin abscess - Encouraged patient to continue warm compresses and sitz bath - keep area clean and dry - RTC with recurrence for marsupialization - referral to dermatology per patient requests

## 2014-06-18 NOTE — Progress Notes (Signed)
Unable to complete dermatology referral. Isabella Powell has Medicaid [place] Access. Instructed her to have her primary care provider complete referral. She states she does not go to that provider any longer- instructed her to contact her medicaid case worker to reassign primary care provider and then make referrl. She voices understanding.

## 2014-07-02 ENCOUNTER — Ambulatory Visit (HOSPITAL_COMMUNITY): Payer: Medicaid Other | Attending: Obstetrics and Gynecology

## 2015-12-29 ENCOUNTER — Emergency Department (HOSPITAL_COMMUNITY)
Admission: EM | Admit: 2015-12-29 | Discharge: 2015-12-29 | Disposition: A | Discharge status: Home / Self Care | Payer: Medicaid Other | Attending: Emergency Medicine | Admitting: Emergency Medicine

## 2015-12-29 ENCOUNTER — Encounter (HOSPITAL_COMMUNITY): Payer: Self-pay

## 2015-12-29 DIAGNOSIS — R531 Weakness: Secondary | ICD-10-CM | POA: Diagnosis not present

## 2015-12-29 DIAGNOSIS — F1290 Cannabis use, unspecified, uncomplicated: Secondary | ICD-10-CM | POA: Insufficient documentation

## 2015-12-29 DIAGNOSIS — R112 Nausea with vomiting, unspecified: Secondary | ICD-10-CM | POA: Insufficient documentation

## 2015-12-29 DIAGNOSIS — R748 Abnormal levels of other serum enzymes: Secondary | ICD-10-CM | POA: Diagnosis not present

## 2015-12-29 DIAGNOSIS — R51 Headache: Secondary | ICD-10-CM | POA: Diagnosis present

## 2015-12-29 DIAGNOSIS — F17210 Nicotine dependence, cigarettes, uncomplicated: Secondary | ICD-10-CM | POA: Diagnosis not present

## 2015-12-29 DIAGNOSIS — Z79899 Other long term (current) drug therapy: Secondary | ICD-10-CM | POA: Insufficient documentation

## 2015-12-29 LAB — COMPREHENSIVE METABOLIC PANEL
ALT: 83 U/L — AB (ref 14–54)
AST: 144 U/L — ABNORMAL HIGH (ref 15–41)
Albumin: 3.9 g/dL (ref 3.5–5.0)
Alkaline Phosphatase: 95 U/L (ref 38–126)
Anion gap: 6 (ref 5–15)
BUN: 7 mg/dL (ref 6–20)
CHLORIDE: 98 mmol/L — AB (ref 101–111)
CO2: 29 mmol/L (ref 22–32)
CREATININE: 0.68 mg/dL (ref 0.44–1.00)
Calcium: 8.9 mg/dL (ref 8.9–10.3)
Glucose, Bld: 101 mg/dL — ABNORMAL HIGH (ref 65–99)
POTASSIUM: 3.7 mmol/L (ref 3.5–5.1)
SODIUM: 133 mmol/L — AB (ref 135–145)
Total Bilirubin: 1.2 mg/dL (ref 0.3–1.2)
Total Protein: 8.2 g/dL — ABNORMAL HIGH (ref 6.5–8.1)

## 2015-12-29 LAB — LIPASE, BLOOD: LIPASE: 25 U/L (ref 11–51)

## 2015-12-29 LAB — URINE MICROSCOPIC-ADD ON

## 2015-12-29 LAB — RAPID URINE DRUG SCREEN, HOSP PERFORMED
Amphetamines: NOT DETECTED
BARBITURATES: NOT DETECTED
BENZODIAZEPINES: NOT DETECTED
Cocaine: POSITIVE — AB
Opiates: NOT DETECTED
Tetrahydrocannabinol: POSITIVE — AB

## 2015-12-29 LAB — CBC
HEMATOCRIT: 36.8 % (ref 36.0–46.0)
HEMOGLOBIN: 12.5 g/dL (ref 12.0–15.0)
MCH: 33.2 pg (ref 26.0–34.0)
MCHC: 34 g/dL (ref 30.0–36.0)
MCV: 97.6 fL (ref 78.0–100.0)
Platelets: 197 10*3/uL (ref 150–400)
RBC: 3.77 MIL/uL — AB (ref 3.87–5.11)
RDW: 12.9 % (ref 11.5–15.5)
WBC: 2.4 10*3/uL — AB (ref 4.0–10.5)

## 2015-12-29 LAB — URINALYSIS, ROUTINE W REFLEX MICROSCOPIC
GLUCOSE, UA: NEGATIVE mg/dL
Ketones, ur: NEGATIVE mg/dL
Nitrite: NEGATIVE
PH: 6.5 (ref 5.0–8.0)
PROTEIN: NEGATIVE mg/dL
SPECIFIC GRAVITY, URINE: 1.022 (ref 1.005–1.030)

## 2015-12-29 LAB — I-STAT BETA HCG BLOOD, ED (MC, WL, AP ONLY): I-stat hCG, quantitative: <5 m[IU]/mL (ref <5)

## 2015-12-29 MED ORDER — METOCLOPRAMIDE HCL 5 MG/ML IJ SOLN
10.0000 mg | Freq: Once | INTRAMUSCULAR | Status: AC
  Administered 2015-12-29: 10 mg via INTRAVENOUS
  Filled 2015-12-29: qty 2

## 2015-12-29 MED ORDER — KETOROLAC TROMETHAMINE 30 MG/ML IJ SOLN
30.0000 mg | Freq: Once | INTRAMUSCULAR | Status: AC
  Administered 2015-12-29: 30 mg via INTRAVENOUS
  Filled 2015-12-29: qty 1

## 2015-12-29 MED ORDER — SODIUM CHLORIDE 0.9 % IV BOLUS (SEPSIS)
1000.0000 mL | Freq: Once | INTRAVENOUS | Status: AC
  Administered 2015-12-29: 1000 mL via INTRAVENOUS

## 2015-12-29 MED ORDER — DIPHENHYDRAMINE HCL 50 MG/ML IJ SOLN
25.0000 mg | Freq: Once | INTRAMUSCULAR | Status: AC
  Administered 2015-12-29: 25 mg via INTRAVENOUS
  Filled 2015-12-29: qty 1

## 2015-12-29 NOTE — ED Notes (Signed)
Patient made aware that urine sample is needed. Patient states she cannot void at this time. Patient requesting something to eat/drink. Pt informed that a provider has to assess her prior to PO intake.

## 2015-12-29 NOTE — Discharge Instructions (Signed)
Your liver tests were elevated. This is most likely related to alcohol. Additionally, your drug screen showed cocaine and marijuana. Recommend stop alcohol and street drugs.

## 2015-12-29 NOTE — ED Notes (Signed)
Pt with multiple complaints.  Headache, body aches, dark urine, n/v  All since yesterday.  No fever.  No abdominal pain

## 2015-12-29 NOTE — ED Notes (Signed)
Pt ambulated to restroom. 

## 2015-12-29 NOTE — ED Notes (Signed)
Bed: WA05 Expected date:  Expected time:  Means of arrival:  Comments: 

## 2015-12-29 NOTE — ED Provider Notes (Signed)
CSN: [id number]     Arrival date & time 12/29/15  1425 History   First MD Initiated Contact with Patient 12/29/15 1833     Chief Complaint  Patient presents with  . Headache  . Emesis     (Consider location/radiation/quality/duration/timing/severity/associated sxs/prior Treatment) HPI...Marland KitchenMarland KitchenDepression, anxiety, frontal headache, body achiness, nausea, vomiting for 24 hours. Patient is drinking excessive amounts of alcohol. Medical history includes psoriasis.  Severity symptoms is moderate. Nothing makes symptoms better or worse.  Past Medical History  Diagnosis Date  . Bartholin cyst   . Psoriasis-like skin disease   . Trichomonas     patient states that many years ago   Past Surgical History  Procedure Laterality Date  . Cesarean section    . Tubal ligation      bilateral   History reviewed. No pertinent family history. Social History  Substance Use Topics  . Smoking status: Current Every Day Smoker -- 0.50 packs/day    Types: Cigarettes  . Smokeless tobacco: Never Used  . Alcohol Use: Yes     Comment: occassionally   OB History    Gravida Para Term Preterm AB TAB SAB Ectopic Multiple Living   Review of Systems    Allergies  Review of patient's allergies indicates no known allergies.  Home Medications   Prior to Admission medications   Medication Sig Start Date End Date Taking? Authorizing Provider  acetaminophen (TYLENOL) 500 MG tablet Take 1,000 mg by mouth every 6 (six) hours as needed for moderate pain.   Yes Historical Provider, MD   BP 101/75 mmHg  Pulse 78  Temp(Src) 98.8 F (37.1 C) (Oral)  Resp 16  SpO2 100%  LMP 12/22/2015 Physical Exam  Constitutional: She is oriented to person, place, and time.  Patient is in no acute distress.  HENT:  Head: Normocephalic and atraumatic.  Eyes: Conjunctivae and EOM are normal. Pupils are equal, round, and reactive to light.  Neck: Normal range of motion. Neck supple.  Cardiovascular:  Normal rate and regular rhythm.   Pulmonary/Chest: Effort normal and breath sounds normal.  Abdominal: Soft. Bowel sounds are normal.  Musculoskeletal: Normal range of motion.  Neurological: She is alert and oriented to person, place, and time.  Skin: Skin is warm and dry.  Psychiatric: She has a normal mood and affect. Her behavior is normal.  Nursing note and vitals reviewed.   ED Course  Procedures (including critical care time) Labs Review Labs Reviewed  COMPREHENSIVE METABOLIC PANEL - Abnormal; Notable for the following:    Sodium 133 (*)    Chloride 98 (*)    Glucose, Bld 101 (*)    Total Protein 8.2 (*)    AST 144 (*)    ALT 83 (*)    All other components within normal limits  CBC - Abnormal; Notable for the following:    WBC 2.4 (*)    RBC 3.77 (*)    All other components within normal limits  URINALYSIS, ROUTINE W REFLEX MICROSCOPIC (NOT AT [facility]) - Abnormal; Notable for the following:    Color, Urine AMBER (*)    Hgb urine dipstick TRACE (*)    Bilirubin Urine SMALL (*)    Leukocytes, UA MODERATE (*)    All other components within normal limits  URINE RAPID DRUG SCREEN, HOSP PERFORMED - Abnormal; Notable for the following:    Cocaine POSITIVE (*)    Tetrahydrocannabinol POSITIVE (*)  All other components within normal limits  URINE MICROSCOPIC-ADD ON - Abnormal; Notable for the following:    Squamous Epithelial / LPF 0-5 (*)    Bacteria, UA RARE (*)    All other components within normal limits  URINE CULTURE  LIPASE, BLOOD  I-STAT BETA HCG BLOOD, ED ([facility], [facility], [facility] ONLY)    Imaging Review No results found. I have personally reviewed and evaluated these images and lab results as part of my medical decision-making.   EKG Interpretation None      MDM   Final diagnoses:  Weakness  Elevated liver enzymes    Patient feels better after IV fluids, IV Reglan, IV Toradol, Benadryl. Her liver functions are elevated. This was discussed in detail with the  patient. Also cocaine and marijuana in the drug screen were addressed.    Donnetta HutchingBrian Makaiyah Schweiger, MD 12/29/15 2330

## 2015-12-29 NOTE — ED Notes (Signed)
Pt made aware of urine sample needed, wanted drink and food.

## 2015-12-29 NOTE — ED Notes (Signed)
Pt reminded of need for urine sample; pt states she still cannot go at this time

## 2015-12-31 LAB — URINE CULTURE

## 2016-10-07 ENCOUNTER — Inpatient Hospital Stay (HOSPITAL_COMMUNITY)
Admission: EM | Admit: 2016-10-07 | Discharge: 2016-10-11 | DRG: 871 | Disposition: A | Discharge status: Home / Self Care | Payer: Medicaid Other | Attending: Internal Medicine | Admitting: Internal Medicine

## 2016-10-07 ENCOUNTER — Emergency Department (HOSPITAL_COMMUNITY): Payer: Medicaid Other

## 2016-10-07 ENCOUNTER — Encounter (HOSPITAL_COMMUNITY): Payer: Self-pay | Admitting: Emergency Medicine

## 2016-10-07 DIAGNOSIS — Z8619 Personal history of other infectious and parasitic diseases: Secondary | ICD-10-CM

## 2016-10-07 DIAGNOSIS — Z9851 Tubal ligation status: Secondary | ICD-10-CM

## 2016-10-07 DIAGNOSIS — M545 Low back pain: Secondary | ICD-10-CM | POA: Diagnosis present

## 2016-10-07 DIAGNOSIS — J9601 Acute respiratory failure with hypoxia: Secondary | ICD-10-CM

## 2016-10-07 DIAGNOSIS — A419 Sepsis, unspecified organism: Secondary | ICD-10-CM | POA: Diagnosis not present

## 2016-10-07 DIAGNOSIS — N10 Acute tubulo-interstitial nephritis: Secondary | ICD-10-CM | POA: Diagnosis present

## 2016-10-07 DIAGNOSIS — R059 Cough, unspecified: Secondary | ICD-10-CM

## 2016-10-07 DIAGNOSIS — R109 Unspecified abdominal pain: Secondary | ICD-10-CM

## 2016-10-07 DIAGNOSIS — J181 Lobar pneumonia, unspecified organism: Secondary | ICD-10-CM

## 2016-10-07 DIAGNOSIS — F17210 Nicotine dependence, cigarettes, uncomplicated: Secondary | ICD-10-CM | POA: Diagnosis present

## 2016-10-07 DIAGNOSIS — R5081 Fever presenting with conditions classified elsewhere: Secondary | ICD-10-CM

## 2016-10-07 DIAGNOSIS — Z720 Tobacco use: Secondary | ICD-10-CM | POA: Diagnosis present

## 2016-10-07 DIAGNOSIS — E876 Hypokalemia: Secondary | ICD-10-CM | POA: Diagnosis present

## 2016-10-07 DIAGNOSIS — R51 Headache: Secondary | ICD-10-CM | POA: Diagnosis present

## 2016-10-07 DIAGNOSIS — R05 Cough: Secondary | ICD-10-CM | POA: Diagnosis not present

## 2016-10-07 DIAGNOSIS — R1030 Lower abdominal pain, unspecified: Secondary | ICD-10-CM

## 2016-10-07 DIAGNOSIS — L409 Psoriasis, unspecified: Secondary | ICD-10-CM | POA: Diagnosis present

## 2016-10-07 DIAGNOSIS — M549 Dorsalgia, unspecified: Secondary | ICD-10-CM

## 2016-10-07 DIAGNOSIS — N390 Urinary tract infection, site not specified: Secondary | ICD-10-CM | POA: Diagnosis not present

## 2016-10-07 DIAGNOSIS — I959 Hypotension, unspecified: Secondary | ICD-10-CM | POA: Diagnosis present

## 2016-10-07 DIAGNOSIS — N12 Tubulo-interstitial nephritis, not specified as acute or chronic: Secondary | ICD-10-CM | POA: Diagnosis present

## 2016-10-07 DIAGNOSIS — N750 Cyst of Bartholin's gland: Secondary | ICD-10-CM | POA: Diagnosis present

## 2016-10-07 DIAGNOSIS — R000 Tachycardia, unspecified: Secondary | ICD-10-CM | POA: Diagnosis present

## 2016-10-07 DIAGNOSIS — D729 Disorder of white blood cells, unspecified: Secondary | ICD-10-CM

## 2016-10-07 DIAGNOSIS — J154 Pneumonia due to other streptococci: Secondary | ICD-10-CM | POA: Diagnosis present

## 2016-10-07 DIAGNOSIS — J189 Pneumonia, unspecified organism: Secondary | ICD-10-CM | POA: Diagnosis present

## 2016-10-07 LAB — COMPREHENSIVE METABOLIC PANEL
ALBUMIN: 4 g/dL (ref 3.5–5.0)
ALK PHOS: 96 U/L (ref 38–126)
ALT: 54 U/L (ref 14–54)
AST: 71 U/L — ABNORMAL HIGH (ref 15–41)
Anion gap: 11 (ref 5–15)
BILIRUBIN TOTAL: 1.1 mg/dL (ref 0.3–1.2)
BUN: 8 mg/dL (ref 6–20)
CALCIUM: 8.9 mg/dL (ref 8.9–10.3)
CO2: 23 mmol/L (ref 22–32)
Chloride: 99 mmol/L — ABNORMAL LOW (ref 101–111)
Creatinine, Ser: 0.73 mg/dL (ref 0.44–1.00)
GLUCOSE: 98 mg/dL (ref 65–99)
POTASSIUM: 3 mmol/L — AB (ref 3.5–5.1)
Sodium: 133 mmol/L — ABNORMAL LOW (ref 135–145)
TOTAL PROTEIN: 9.1 g/dL — AB (ref 6.5–8.1)

## 2016-10-07 LAB — CBC WITH DIFFERENTIAL/PLATELET
BASOS ABS: 0 10*3/uL (ref 0.0–0.1)
BASOS PCT: 0 %
Eosinophils Absolute: 0 10*3/uL (ref 0.0–0.7)
Eosinophils Relative: 0 %
HCT: 37.3 % (ref 36.0–46.0)
Hemoglobin: 12.6 g/dL (ref 12.0–15.0)
Lymphocytes Relative: 6 %
Lymphs Abs: 1.4 10*3/uL (ref 0.7–4.0)
MCH: 33.2 pg (ref 26.0–34.0)
MCHC: 33.8 g/dL (ref 30.0–36.0)
MCV: 98.4 fL (ref 78.0–100.0)
MONO ABS: 1.5 10*3/uL — AB (ref 0.1–1.0)
Monocytes Relative: 7 %
NEUTROS ABS: 18.6 10*3/uL — AB (ref 1.7–7.7)
Neutrophils Relative %: 87 %
PLATELETS: 177 10*3/uL (ref 150–400)
RBC: 3.79 MIL/uL — AB (ref 3.87–5.11)
RDW: 12.7 % (ref 11.5–15.5)
WBC: 21.5 10*3/uL — AB (ref 4.0–10.5)

## 2016-10-07 LAB — LIPASE, BLOOD: Lipase: 16 U/L (ref 11–51)

## 2016-10-07 LAB — URINALYSIS, ROUTINE W REFLEX MICROSCOPIC
BILIRUBIN URINE: NEGATIVE
Bacteria, UA: NONE SEEN
GLUCOSE, UA: NEGATIVE mg/dL
HGB URINE DIPSTICK: NEGATIVE
KETONES UR: NEGATIVE mg/dL
NITRITE: NEGATIVE
PH: 6 (ref 5.0–8.0)
PROTEIN: NEGATIVE mg/dL
Specific Gravity, Urine: 1.008 (ref 1.005–1.030)

## 2016-10-07 LAB — I-STAT CG4 LACTIC ACID, ED: Lactic Acid, Venous: 1.83 mmol/L (ref 0.5–1.9)

## 2016-10-07 LAB — I-STAT BETA HCG BLOOD, ED (MC, WL, AP ONLY)

## 2016-10-07 MED ORDER — DEXTROSE 5 % IV SOLN
1.0000 g | INTRAVENOUS | Status: DC
  Administered 2016-10-08 – 2016-10-10 (×3): 1 g via INTRAVENOUS
  Filled 2016-10-07 (×4): qty 10

## 2016-10-07 MED ORDER — IBUPROFEN 800 MG PO TABS
800.0000 mg | ORAL_TABLET | Freq: Once | ORAL | Status: DC
  Filled 2016-10-07: qty 1

## 2016-10-07 MED ORDER — SODIUM CHLORIDE 0.9 % IV SOLN
1000.0000 mL | INTRAVENOUS | Status: DC
  Administered 2016-10-07 – 2016-10-10 (×8): 1000 mL via INTRAVENOUS

## 2016-10-07 MED ORDER — ENOXAPARIN SODIUM 40 MG/0.4ML ~~LOC~~ SOLN
40.0000 mg | Freq: Every day | SUBCUTANEOUS | Status: DC
  Administered 2016-10-07 – 2016-10-09 (×3): 40 mg via SUBCUTANEOUS
  Filled 2016-10-07 (×3): qty 0.4

## 2016-10-07 MED ORDER — ACETAMINOPHEN 325 MG PO TABS
650.0000 mg | ORAL_TABLET | Freq: Four times a day (QID) | ORAL | Status: DC | PRN
  Administered 2016-10-08 – 2016-10-10 (×5): 650 mg via ORAL
  Filled 2016-10-07 (×6): qty 2

## 2016-10-07 MED ORDER — SODIUM CHLORIDE 0.9 % IV BOLUS (SEPSIS)
500.0000 mL | Freq: Once | INTRAVENOUS | Status: AC
  Administered 2016-10-07: 500 mL via INTRAVENOUS

## 2016-10-07 MED ORDER — SODIUM CHLORIDE 0.9 % IV BOLUS (SEPSIS)
1000.0000 mL | Freq: Once | INTRAVENOUS | Status: AC
  Administered 2016-10-07: 1000 mL via INTRAVENOUS

## 2016-10-07 MED ORDER — MORPHINE SULFATE (PF) 4 MG/ML IV SOLN
4.0000 mg | Freq: Once | INTRAVENOUS | Status: AC
  Administered 2016-10-07: 4 mg via INTRAVENOUS
  Filled 2016-10-07: qty 1

## 2016-10-07 MED ORDER — DEXTROSE 5 % IV SOLN
500.0000 mg | INTRAVENOUS | Status: DC
  Filled 2016-10-07: qty 500

## 2016-10-07 MED ORDER — PNEUMOCOCCAL VAC POLYVALENT 25 MCG/0.5ML IJ INJ
0.5000 mL | INJECTION | INTRAMUSCULAR | Status: DC
  Filled 2016-10-07 (×2): qty 0.5

## 2016-10-07 MED ORDER — ONDANSETRON HCL 4 MG/2ML IJ SOLN
4.0000 mg | Freq: Once | INTRAMUSCULAR | Status: AC
  Administered 2016-10-07: 4 mg via INTRAVENOUS
  Filled 2016-10-07: qty 2

## 2016-10-07 MED ORDER — ACETAMINOPHEN 325 MG PO TABS
650.0000 mg | ORAL_TABLET | Freq: Once | ORAL | Status: AC | PRN
  Administered 2016-10-07: 650 mg via ORAL
  Filled 2016-10-07: qty 2

## 2016-10-07 MED ORDER — SODIUM CHLORIDE 0.9 % IV BOLUS (SEPSIS)
250.0000 mL | Freq: Once | INTRAVENOUS | Status: AC
  Administered 2016-10-07: 250 mL via INTRAVENOUS

## 2016-10-07 MED ORDER — IOPAMIDOL (ISOVUE-300) INJECTION 61%
INTRAVENOUS | Status: AC
  Administered 2016-10-07: 22:00:00
  Filled 2016-10-07: qty 30

## 2016-10-07 MED ORDER — ONDANSETRON HCL 4 MG/2ML IJ SOLN
4.0000 mg | Freq: Once | INTRAMUSCULAR | Status: AC
  Administered 2016-10-07: 4 mg via INTRAVENOUS

## 2016-10-07 MED ORDER — DEXTROSE 5 % IV SOLN
2.0000 g | Freq: Once | INTRAVENOUS | Status: AC
  Administered 2016-10-07: 2 g via INTRAVENOUS
  Filled 2016-10-07: qty 2

## 2016-10-07 MED ORDER — DEXTROSE 5 % IV SOLN
500.0000 mg | Freq: Once | INTRAVENOUS | Status: AC
  Administered 2016-10-07: 500 mg via INTRAVENOUS
  Filled 2016-10-07: qty 500

## 2016-10-07 MED ORDER — POTASSIUM CHLORIDE CRYS ER 20 MEQ PO TBCR
60.0000 meq | EXTENDED_RELEASE_TABLET | Freq: Once | ORAL | Status: AC
  Administered 2016-10-07: 60 meq via ORAL
  Filled 2016-10-07: qty 3

## 2016-10-07 MED ORDER — IBUPROFEN 800 MG PO TABS
800.0000 mg | ORAL_TABLET | Freq: Once | ORAL | Status: AC
  Administered 2016-10-07: 800 mg via ORAL
  Filled 2016-10-07: qty 1

## 2016-10-07 NOTE — ED Provider Notes (Signed)
[facility]-EMERGENCY DEPT Provider Note   CSN: [id number] Arrival date & time: 10/07/16  1823     History   Chief Complaint Chief Complaint  Patient presents with  . Back Pain  . Fever    HPI Isabella Powell is a 46 y.o. female with a PMHx of psoriasis and remote pyelonephritis, and a PSHx of b/l tubal ligation, who presents to the ED with complaints of gradual onset gradually worsening bilateral flank/lower back pain 5 days (starting Sunday). Patient states that initially it was fairly mild and not bothersome, but over the last 24 hours it has gradually worsened. She describes the pain as 10/10 constant sharp lateral flank/low back pain radiating into the suprapubic area, worse with laying flat, and unrelieved with heat compresses, Goody's powders, and muscle relaxers (unknown medication name). Associated symptoms include fever with Tmax 103.1 here, chills, increased urinary frequency and urgency, and nausea. She states that she had pyelonephritis 21 years ago and this feels somewhat similar. She also reports that she developed a dry cough and mild rhinorrhea on Tuesday (3 days ago). She denies history of IV drug use or cancer. Just recently had her LMP on 09/15/16, states they're somewhat irregular because she's going through menopause. +Smoker. No known sick contacts. No recent hospitalizations or exposure to nursing home patients. Does not currently have a PCP. Denies having DM2, HIV, or other immunocompromising conditions that she's aware of.   She denies sore throat, CP, SOB, V/D/C, obstipation, melena, hematochezia, hematuria, dysuria, incontinence of urine/stool, vaginal bleeding/discharge, myalgias, arthralgias, saddle anesthesia/cauda equina symptoms, numbness, tingling, focal weakness, or any other complaints at this time.    The history is provided by the patient and medical records. No language interpreter was used.  Back Pain   Associated symptoms include a fever and abdominal pain  (radiating from flanks). Pertinent negatives include no chest pain, no numbness, no dysuria and no weakness.  Fever   Associated symptoms include cough. Pertinent negatives include no chest pain, no diarrhea, no vomiting and no sore throat.  Flank Pain  This is a new problem. The current episode started more than 2 days ago. The problem occurs constantly. The problem has been gradually worsening. Associated symptoms include abdominal pain (radiating from flanks). Pertinent negatives include no chest pain and no shortness of breath. Exacerbated by: laying flat. Nothing relieves the symptoms. She has tried a warm compress (heat compress, Goody's powders, and muscle relaxant) for the symptoms. The treatment provided no relief.    Past Medical History:  Diagnosis Date  . Bartholin cyst   . Psoriasis-like skin disease   . Trichomonas    patient states that many years ago    There are no active problems to display for this patient.   Past Surgical History:  Procedure Laterality Date  . CESAREAN SECTION    . TUBAL LIGATION     bilateral    OB History    Gravida Para Term Preterm AB Living   7 5 4 1 2 5    SAB TAB Ectopic Multiple Live Births   1 1             Home Medications    Prior to Admission medications   Medication Sig Start Date End Date Taking? Authorizing Provider  acetaminophen (TYLENOL) 500 MG tablet Take 1,000 mg by mouth every 6 (six) hours as needed for moderate pain.    Historical Provider, MD    Family History No family history on file.  Social History Social  History  Substance Use Topics  . Smoking status: Current Every Day Smoker    Packs/day: 0.50    Types: Cigarettes  . Smokeless tobacco: Never Used  . Alcohol use Yes     Comment: occassionally     Allergies   Patient has no known allergies.   Review of Systems Review of Systems  Constitutional: Positive for chills and fever.  HENT: Positive for rhinorrhea. Negative for sore throat.     Respiratory: Positive for cough. Negative for shortness of breath.   Cardiovascular: Negative for chest pain.  Gastrointestinal: Positive for abdominal pain (radiating from flanks) and nausea. Negative for blood in stool, constipation, diarrhea and vomiting.  Genitourinary: Positive for flank pain, frequency and urgency. Negative for dysuria, hematuria, vaginal bleeding and vaginal discharge.       No malodorous urine No incontinence  Musculoskeletal: Positive for back pain (flanks). Negative for arthralgias and myalgias.  Skin: Negative for color change.  Allergic/Immunologic: Negative for immunocompromised state.  Neurological: Negative for weakness and numbness.  Psychiatric/Behavioral: Negative for confusion.   10 Systems reviewed and are negative for acute change except as noted in the HPI.   Physical Exam Updated Vital Signs BP 94/61 (BP Location: Right Arm)   Pulse (!) 127   Temp (!) 103.1 F (39.5 C) (Oral)   Resp 18   Ht 5\' 7"  (1.702 m)   Wt 54 kg   SpO2 100%   BMI 18.64 kg/m   Physical Exam  Constitutional: She is oriented to person, place, and time. She appears well-developed and well-nourished.  Non-toxic appearance. No distress.  Febrile 103.1, shivering, mildly tachycardic, BP soft 94/61 however does not appear septic, and actually nontoxic appearing  HENT:  Head: Normocephalic and atraumatic.  Mouth/Throat: Oropharynx is clear and moist and mucous membranes are normal.  Eyes: Conjunctivae and EOM are normal. Right eye exhibits no discharge. Left eye exhibits no discharge.  Neck: Normal range of motion. Neck supple.  Cardiovascular: Regular rhythm, normal heart sounds and intact distal pulses.  Tachycardia present.  Exam reveals no gallop and no friction rub.   No murmur heard. Mildly tachycardic in 110-120s  Pulmonary/Chest: Effort normal and breath sounds normal. No respiratory distress. She has no decreased breath sounds. She has no wheezes. She has no  rhonchi. She has no rales.  Poor inspiratory effort, but overall CTAB in all lung fields, no w/r/r, no hypoxia or increased WOB, speaking in full sentences, SpO2 100% on RA   Abdominal: Soft. Normal appearance and bowel sounds are normal. She exhibits no distension. There is tenderness in the right lower quadrant and suprapubic area. There is CVA tenderness. There is no rigidity, no rebound, no guarding, no tenderness at McBurney's point and negative Murphy's sign.    Soft, nondistended, +BS throughout, with mild suprapubic and somewhat RLQ TTP near the pelvic brim, no r/g/r, neg murphy's, no mcburney's point tenderness, and with bilateral CVA TTP   Musculoskeletal: Normal range of motion.  All spinal levels with FROM intact without spinous process TTP, no bony stepoffs or deformities, no paraspinous muscle TTP or muscle spasms although with +bilateral CVA TTP noted. Strength and sensation grossly intact in all extremities, gait steady and nonantalgic. No overlying skin changes. Distal pulses intact.   Neurological: She is alert and oriented to person, place, and time. She has normal strength. No sensory deficit.  Skin: Skin is warm, dry and intact. No rash noted.  Psychiatric: She has a normal mood and affect.  Nursing note  and vitals reviewed.    ED Treatments / Results  Labs (all labs ordered are listed, but only abnormal results are displayed) Labs Reviewed  URINALYSIS, ROUTINE W REFLEX MICROSCOPIC - Abnormal; Notable for the following:       Result Value   Leukocytes, UA LARGE (*)    Squamous Epithelial / LPF 0-5 (*)    All other components within normal limits  COMPREHENSIVE METABOLIC PANEL - Abnormal; Notable for the following:    Sodium 133 (*)    Potassium 3.0 (*)    Chloride 99 (*)    Total Protein 9.1 (*)    AST 71 (*)    All other components within normal limits  CBC WITH DIFFERENTIAL/PLATELET - Abnormal; Notable for the following:    WBC 21.5 (*)    RBC 3.79 (*)     Neutro Abs 18.6 (*)    Monocytes Absolute 1.5 (*)    All other components within normal limits  CULTURE, BLOOD (ROUTINE X 2)  CULTURE, BLOOD (ROUTINE X 2)  URINE CULTURE  LIPASE, BLOOD  I-STAT CG4 LACTIC ACID, ED  I-STAT BETA HCG BLOOD, ED ([facility], [facility], [facility] ONLY)  I-STAT CG4 LACTIC ACID, ED    EKG  EKG Interpretation None       Radiology Ct Abdomen Pelvis Wo Contrast  Result Date: 10/07/2016 CLINICAL DATA:  Low back pain with fever EXAM: CT ABDOMEN AND PELVIS WITHOUT CONTRAST TECHNIQUE: Multidetector CT imaging of the abdomen and pelvis was performed following the standard protocol without IV contrast. COMPARISON:  None. FINDINGS: Lower chest: Mild emphysematous disease. Dense area of consolidation in the right lower lobe with small peripheral nodules. No pleural effusion. Normal heart size. Hepatobiliary: No focal hepatic abnormality. No biliary dilatation. No calcified gallstones. Possible mild gallbladder wall thickening. Pancreas: Unremarkable. No pancreatic ductal dilatation or surrounding inflammatory changes. Spleen: Normal in size without focal abnormality. Adrenals/Urinary Tract: Slight fullness of the right renal collecting system in ureter but no calcified stones. This is likely secondary to bladder distention. Bladder demonstrates normal wall thickness. Left kidney within normal limits. Adrenal glands are unremarkable. Stomach/Bowel: Stomach is within normal limits. Appendix appears normal. No evidence of bowel wall thickening, distention, or inflammatory changes. Vascular/Lymphatic: No significant vascular findings are present. No enlarged abdominal or pelvic lymph nodes. Reproductive: Bilateral tubal ligation clips.  No adnexal masses. Other: No free air or free fluid. Musculoskeletal: No acute or significant osseous findings. IMPRESSION: 1. Mild emphysematous disease at the lung bases. Dense consolidation in the right lower lobe consistent with a pneumonia. Small nodules at the  periphery of the consolidation are likely inflammatory/infectious. 2. Possible mild wall thickening of the gallbladder. Correlation with ultrasound may be obtained as clinically indicated 3. Slight fullness of the right renal collecting system and ureter, but no evidence for ureteral calculi. Electronically Signed   By: Jasmine Pang M.D.   On: 10/07/2016 21:30   Dg Chest 2 View  Result Date: 10/07/2016 CLINICAL DATA:  Fever, cough. EXAM: CHEST  2 VIEW COMPARISON:  None. FINDINGS: The heart size and mediastinal contours are within normal limits. No pneumothorax or pleural effusion is noted. Left lung is clear. Right lower lobe airspace opacity is noted concerning for pneumonia. The visualized skeletal structures are unremarkable. IMPRESSION: Probable right lower lobe pneumonia. Followup PA and lateral chest X-ray is recommended in 3-4 weeks following trial of antibiotic therapy to ensure resolution and exclude underlying malignancy. Electronically Signed   By: Lupita Raider, M.D.   On: 10/07/2016  19:32    Procedures Procedures (including critical care time)  CRITICAL CARE- sepsis Performed by: Rhona RaiderMercedes Nicholad Kautzman   Total critical care time: 45 minutes  Critical care time was exclusive of separately billable procedures and treating other patients.  Critical care was necessary to treat or prevent imminent or life-threatening deterioration.  Critical care was time spent personally by me on the following activities: development of treatment plan with patient and/or surrogate as well as nursing, discussions with consultants, evaluation of patient's response to treatment, examination of patient, obtaining history from patient or surrogate, ordering and performing treatments and interventions, ordering and review of laboratory studies, ordering and review of radiographic studies, pulse oximetry and re-evaluation of patient's condition.  Sepsis - Repeat Assessment Performed at:   8:11  PM  Vitals     Blood pressure 116/68, pulse (!) 109, resp. rate (!) 21, SpO2 96 %. TEMP NOT RECHECKED AT THIS TIME.  Heart:     Tachycardic Lungs:    CTA Capillary Refill:   <2 sec Peripheral Pulse:   Radial pulse palpable and Dorsalis pedis pulse  palpable Skin:     Normal Color and Dry     Medications Ordered in ED Medications  0.9 %  sodium chloride infusion (1,000 mLs Intravenous New Bag/Given 10/07/16 2022)  iopamidol (ISOVUE-300) 61 % injection (not administered)  cefTRIAXone (ROCEPHIN) 1 g in dextrose 5 % 50 mL IVPB (not administered)  potassium chloride SA (K-DUR,KLOR-CON) CR tablet 60 mEq (not administered)  ibuprofen (ADVIL,MOTRIN) tablet 800 mg (not administered)  morphine 4 MG/ML injection 4 mg (not administered)  acetaminophen (TYLENOL) tablet 650 mg (650 mg Oral Given 10/07/16 2005)  sodium chloride 0.9 % bolus 1,000 mL (0 mLs Intravenous Stopped 10/07/16 2041)    And  sodium chloride 0.9 % bolus 500 mL (0 mLs Intravenous Stopped 10/07/16 2009)    And  sodium chloride 0.9 % bolus 250 mL (0 mLs Intravenous Stopped 10/07/16 2017)  cefTRIAXone (ROCEPHIN) 2 g in dextrose 5 % 50 mL IVPB (0 g Intravenous Stopped 10/07/16 2021)  ondansetron (ZOFRAN) injection 4 mg (4 mg Intravenous Given 10/07/16 1945)  morphine 4 MG/ML injection 4 mg (4 mg Intravenous Given 10/07/16 1950)  azithromycin (ZITHROMAX) 500 mg in dextrose 5 % 250 mL IVPB (500 mg Intravenous New Bag/Given 10/07/16 2032)  ondansetron (ZOFRAN) injection 4 mg (4 mg Intravenous Given 10/07/16 2023)     Initial Impression / Assessment and Plan / ED Course  I have reviewed the triage vital signs and the nursing notes.  Pertinent labs & imaging results that were available during my care of the patient were reviewed by me and considered in my medical decision making (see chart for details).     46 y.o. female here with gradual onset b/l flank/low back pain x5 days, worse today, with associated fever 103.1 here, nausea, urine  freq/urgency; also had dry cough and mild rhinorrhea as well. On exam, suprapubic and slight RLQ TTP, nonperitoneal, not quite at mcburney's point, with b/l CVA TTP; febrile 103.1, tachycardic in the 120s, BP soft 94/61 however pt actually doesn't appears septic despite these SIRS criteria vitals. Clear lung exam, although poor inspiratory effort slightly limits exam. No focal midline spine TTP, no s/sx of cauda equina; denies IVDU or hx of CA. Denies vaginal complaints, given that it's likely that this is pyelonephritis vs intraabdominal etiology, will hold off for now on performing pelvic exam; however, if work up doesn't reveal definite etiology, may need to consider pelvic exam. Will  start sepsis work up labs, get U/A, UCx, CBC w/diff, CMP, lipase, BCx, lactic, and betaHCG. Will also get CXR and CT abd/pelv without contrast to eval for pyelo vs appendicitis vs other etiology. Will give wt based fluids, zofran, morphine, and tylenol for fever; will also start rocephin since it seems most likely that it's a urinary source and since rocephin would also cover some pulmonary etiologies, however will change empiric abx if work up reveals other etiology. Will reassess shortly. Discussed case with my attending Dr. Verdie Mosher who agrees with plan.   8:11 PM CBC w/diff showing leukocytosis 21.5 with neutrophilic predominance. Lactic 1.83 which is reassuring. BetaHCG neg. CXR shows probable RLL PNA, which could also be the source of her fever and back pain, will add-on Azithromycin to the previously ordered rocephin in order to get coverage for CAP. U/A, CMP, and lipase pending; CT abd/pelv not yet done. Vitals improving after fluids started and tylenol given, BP 116/68 and HR down to low 100s. Pt reports pain and nausea improvement, still feels cold, will see what her fever is after ~93mins post Tylenol and if necessary will give ibuprofen. Will reassess shortly.  9:44 PM CMP with mildly low K 3.0, will replete orally;  also with mildly elevated AST 71 but otherwise unremarkable, and LFTs have been elevated in the past, today's value is less than previous. Lipase WNL. U/A with large leuks, 6-30 WBCs, only 0-5 squamous, but no bacteria seen; somewhat inconsistent results but given leukocytes and WBCs without significant contamination in specimen, then it could still be UTI/pyelonephritis, UCx would help determine if this is also present or not; abx will cover for urinary source anyway.  CT abd/pelv showing RLL PNA again, and slight fullness in R renal collecting system and ureter (?pyelo) without calculi seen; also shows possible gallbladder wall thickening, consider U/S if clinically indicated; given lack of murphy's on exam, and no significant LFT changes, doubt need for this urgently at this time. Pt's fever down slightly after tylenol, but still 102F, will give ibuprofen. HR still slightly elevated but improving; BP improving and stable. Pt's pain coming back, although overall better than initially; will repeat morphine now. Will proceed with admission for PNA and possible pyelonephritis tx.   9:54 PM Dr. Julian Reil of [facility] returning page and will admit. Holding orders to be placed by admitting team. Please see their notes for further documentation of care. I appreciate their help with this pleasant pt's care. Pt stable at time of admission.   Final Clinical Impressions(s) / ED Diagnoses   Final diagnoses:  Flank pain  Fever in other diseases  Sepsis, due to unspecified organism (HCC)  Cough  Tobacco user  Community acquired pneumonia of right lower lobe of lung (HCC)  Neutrophilic leukocytosis  Hypokalemia  Acute pyelonephritis    New Prescriptions New Prescriptions   No medications on file     [address] PA-C 10/07/16 2154    Lavera Guise, MD 10/08/16 1512

## 2016-10-07 NOTE — ED Notes (Signed)
Bed: WTR5 Expected date:  Expected time:  Means of arrival:  Comments: Ems back pain triage

## 2016-10-07 NOTE — Progress Notes (Signed)
Pharmacy Antibiotic Note  Isabella ChromanRicetta C Powell is a 46 y.o. female admitted on 10/07/2016 with UTI.  Pharmacy has been consulted for ceftriaxone dosing.  Plan: Ceftriaxone 2gm IV x 1 per ED  Then ceftriaxone 1gm IV q24h Pharmacy will sign off as no renal adjustment necessary  Height: 5\' 7"  (170.2 cm) Weight: 119 lb (54 kg) IBW/kg (Calculated) : 61.6  Temp (24hrs), Avg:103.1 F (39.5 C), Min:103.1 F (39.5 C), Max:103.1 F (39.5 C)  No results for input(s): WBC, CREATININE, LATICACIDVEN, VANCOTROUGH, VANCOPEAK, VANCORANDOM, GENTTROUGH, GENTPEAK, GENTRANDOM, TOBRATROUGH, TOBRAPEAK, TOBRARND, AMIKACINPEAK, AMIKACINTROU, AMIKACIN in the last 168 hours.  CrCl cannot be calculated (Patient's most recent lab result is older than the maximum 21 days allowed.).    No Known Allergies  Antimicrobials this admission: 3/23 ceftriaxone >>   Microbiology results: 3/23 BCx: sent 3/23 UCx: sent   Thank you for allowing pharmacy to be a part of this patient's care.  Arley Phenixllen Jaye Polidori RPh 10/07/2016, 7:12 PM Pager [phone]

## 2016-10-07 NOTE — ED Triage Notes (Addendum)
Per EMS, patient is complaining of lower back pain. Rates pain 10/10. Patient is conscious, alert, oriented, ambulatory. Denies nausea, vomiting, diarrhea, shortness of breath, chest pain, dysuria. Reports she had a urinary tract infection 20 years ago and it felt just like this. Patient reports she is "freezing." Patient has fever of 103.1 oral.

## 2016-10-07 NOTE — ED Notes (Signed)
Bed: WA01 Expected date:  Expected time:  Means of arrival:  Comments: Triage 5  

## 2016-10-07 NOTE — H&P (Addendum)
History and Physical    Isabella ChromanRicetta C Burbage ZOX:[id number] DOB: October 25, 1970 DOA: 10/07/2016  PCP: No PCP Per Patient  Patient coming from: Home  I have personally briefly reviewed [facility]  Chief Complaint: Fever, flank pain  HPI: Isabella Powell is a 46 y.o. female with medical history significant of psoriasis and remote pyelonephritis, and a PSHx of b/l tubal ligation, who presents to the ED with complaints of gradual onset gradually worsening bilateral flank/lower back pain 5 days (starting Sunday).  Patient states that initially it was fairly mild and not bothersome, but over the last 24 hours it has gradually worsened. She describes the pain as 10/10 constant sharp lateral flank/low back pain radiating into the suprapubic area, worse with laying flat, and unrelieved with heat compresses, Goody's powders, and muscle relaxers (unknown medication name). Associated symptoms include fever with Tmax 103.1 here, chills, increased urinary frequency and urgency, and nausea.  Dry cough and rhinorrhea onset Tuesday.   ED Course: RLL PNA, UA shows possible UTI.   Review of Systems: As per HPI otherwise 10 point review of systems negative.   Past Medical History:  Diagnosis Date  . Bartholin cyst   . Psoriasis-like skin disease   . Trichomonas    patient states that many years ago    Past Surgical History:  Procedure Laterality Date  . CESAREAN SECTION    . TUBAL LIGATION     bilateral     reports that she has been smoking Cigarettes.  She has been smoking about 0.50 packs per day. She has never used smokeless tobacco. She reports that she drinks alcohol. She reports that she uses drugs, including Marijuana.  No Known Allergies  No family history on file.   Prior to Admission medications   Medication Sig Start Date End Date Taking? Authorizing Provider  Aspirin-Acetaminophen-Caffeine (GOODY HEADACHE PO) Take 1 packet by mouth every 4 (four)  hours as needed (pain).   Yes Historical Provider, MD  Doxylamine Succinate, Sleep, (SLEEP AID PO) Take 1-3 tablets by mouth at bedtime as needed (sleep).   Yes Historical Provider, MD    Physical Exam: Vitals:   10/07/16 1832 10/07/16 2018 10/07/16 2118 10/07/16 2126  BP:  116/68 107/60   Pulse:  (!) 109 (!) 118   Resp:  (!) 21 16   Temp:  (!) 103.1 F (39.5 C)  (!) 102 F (38.9 C)  TempSrc:  Oral  Oral  SpO2:  96% 96%   Weight: 54 kg (119 lb)     Height: 5\' 7"  (1.702 m)       Constitutional: NAD, calm, comfortable Eyes: PERRL, lids and conjunctivae normal ENMT: Mucous membranes are moist. Posterior pharynx clear of any exudate or lesions.Normal dentition.  Neck: normal, supple, no masses, no thyromegaly Respiratory: clear to auscultation bilaterally, no wheezing, no crackles. Normal respiratory effort. No accessory muscle use.  Cardiovascular: Regular rate and rhythm, no murmurs / rubs / gallops. No extremity edema. 2+ pedal pulses. No carotid bruits.  Abdomen: no tenderness, no masses palpated. No hepatosplenomegaly. Bowel sounds positive.  Musculoskeletal: no clubbing / cyanosis. No joint deformity upper and lower extremities. Good ROM, no contractures. Normal muscle tone.  Skin: no rashes, lesions, ulcers. No induration Neurologic: CN 2-12 grossly intact. Sensation intact, DTR normal. Strength 5/5 in all 4.  Psychiatric: Normal judgment and insight. Alert and oriented x 3. Normal mood.    Labs on Admission: I have personally reviewed following labs and imaging  studies  CBC:  Recent Labs Lab 10/07/16 1939  WBC 21.5*  NEUTROABS 18.6*  HGB 12.6  HCT 37.3  MCV 98.4  PLT 177   Basic Metabolic Panel:  Recent Labs Lab 10/07/16 1939  NA 133*  K 3.0*  CL 99*  CO2 23  GLUCOSE 98  BUN 8  CREATININE 0.73  CALCIUM 8.9   GFR: Estimated Creatinine Clearance: 76.5 mL/min (by C-G formula based on SCr of 0.73 mg/dL). Liver Function Tests:  Recent Labs Lab  10/07/16 1939  AST 71*  ALT 54  ALKPHOS 96  BILITOT 1.1  PROT 9.1*  ALBUMIN 4.0    Recent Labs Lab 10/07/16 1939  LIPASE 16   No results for input(s): AMMONIA in the last 168 hours. Coagulation Profile: No results for input(s): INR, PROTIME in the last 168 hours. Cardiac Enzymes: No results for input(s): CKTOTAL, CKMB, CKMBINDEX, TROPONINI in the last 168 hours. BNP (last 3 results) No results for input(s): PROBNP in the last 8760 hours. HbA1C: No results for input(s): HGBA1C in the last 72 hours. CBG: No results for input(s): GLUCAP in the last 168 hours. Lipid Profile: No results for input(s): CHOL, HDL, LDLCALC, TRIG, CHOLHDL, LDLDIRECT in the last 72 hours. Thyroid Function Tests: No results for input(s): TSH, T4TOTAL, FREET4, T3FREE, THYROIDAB in the last 72 hours. Anemia Panel: No results for input(s): VITAMINB12, FOLATE, FERRITIN, TIBC, IRON, RETICCTPCT in the last 72 hours. Urine analysis:    Component Value Date/Time   COLORURINE YELLOW 10/07/2016 1959   APPEARANCEUR CLEAR 10/07/2016 1959   LABSPEC 1.008 10/07/2016 1959   PHURINE 6.0 10/07/2016 1959   GLUCOSEU NEGATIVE 10/07/2016 1959   HGBUR NEGATIVE 10/07/2016 1959   BILIRUBINUR NEGATIVE 10/07/2016 1959   KETONESUR NEGATIVE 10/07/2016 1959   PROTEINUR NEGATIVE 10/07/2016 1959   UROBILINOGEN 4.0 (H) 10/11/2007 1325   NITRITE NEGATIVE 10/07/2016 1959   LEUKOCYTESUR LARGE (A) 10/07/2016 1959    Radiological Exams on Admission: Ct Abdomen Pelvis Wo Contrast  Result Date: 10/07/2016 CLINICAL DATA:  Low back pain with fever EXAM: CT ABDOMEN AND PELVIS WITHOUT CONTRAST TECHNIQUE: Multidetector CT imaging of the abdomen and pelvis was performed following the standard protocol without IV contrast. COMPARISON:  None. FINDINGS: Lower chest: Mild emphysematous disease. Dense area of consolidation in the right lower lobe with small peripheral nodules. No pleural effusion. Normal heart size. Hepatobiliary: No focal  hepatic abnormality. No biliary dilatation. No calcified gallstones. Possible mild gallbladder wall thickening. Pancreas: Unremarkable. No pancreatic ductal dilatation or surrounding inflammatory changes. Spleen: Normal in size without focal abnormality. Adrenals/Urinary Tract: Slight fullness of the right renal collecting system in ureter but no calcified stones. This is likely secondary to bladder distention. Bladder demonstrates normal wall thickness. Left kidney within normal limits. Adrenal glands are unremarkable. Stomach/Bowel: Stomach is within normal limits. Appendix appears normal. No evidence of bowel wall thickening, distention, or inflammatory changes. Vascular/Lymphatic: No significant vascular findings are present. No enlarged abdominal or pelvic lymph nodes. Reproductive: Bilateral tubal ligation clips.  No adnexal masses. Other: No free air or free fluid. Musculoskeletal: No acute or significant osseous findings. IMPRESSION: 1. Mild emphysematous disease at the lung bases. Dense consolidation in the right lower lobe consistent with a pneumonia. Small nodules at the periphery of the consolidation are likely inflammatory/infectious. 2. Possible mild wall thickening of the gallbladder. Correlation with ultrasound may be obtained as clinically indicated 3. Slight fullness of the right renal collecting system and ureter, but no evidence for ureteral calculi. Electronically Signed  By: Jasmine Pang M.D.   On: 10/07/2016 21:30   Dg Chest 2 View  Result Date: 10/07/2016 CLINICAL DATA:  Fever, cough. EXAM: CHEST  2 VIEW COMPARISON:  None. FINDINGS: The heart size and mediastinal contours are within normal limits. No pneumothorax or pleural effusion is noted. Left lung is clear. Right lower lobe airspace opacity is noted concerning for pneumonia. The visualized skeletal structures are unremarkable. IMPRESSION: Probable right lower lobe pneumonia. Followup PA and lateral chest X-ray is recommended in 3-4  weeks following trial of antibiotic therapy to ensure resolution and exclude underlying malignancy. Electronically Signed   By: Lupita Raider, M.D.   On: 10/07/2016 19:32    EKG: Independently reviewed.  Assessment/Plan Principal Problem:   Community acquired pneumonia of right lower lobe of lung (HCC) Active Problems:   Sepsis (HCC)   Acute lower UTI    1. CAP of RLL - 1. PNA pathway 2. Rocephin and azithromycin 3. Cultures pending 2. Sepsis - 1. IVF 2. Repeat labs in AM, trend WBC 3. Tele monitor for tachycardia 4. Tylenol PRN fever 3. UTI - 1. Culture pending 2. On rocephin as above 4. Hypokalemia - replacing potassium, recheck in AM  DVT prophylaxis: Lovenox Code Status: Full Family Communication: No family in room Disposition Plan: Home after admit Consults called: None Admission status: Place in obs   GARDNER, Heywood Iles. DO [facility] Pager [phone]  If 7AM-7PM, please contact day team taking care of patient [email or web address] Password [email or web address]  10/07/2016, 10:11 PM

## 2016-10-08 DIAGNOSIS — N750 Cyst of Bartholin's gland: Secondary | ICD-10-CM | POA: Diagnosis present

## 2016-10-08 DIAGNOSIS — N10 Acute tubulo-interstitial nephritis: Secondary | ICD-10-CM | POA: Diagnosis present

## 2016-10-08 DIAGNOSIS — R51 Headache: Secondary | ICD-10-CM | POA: Diagnosis present

## 2016-10-08 DIAGNOSIS — R000 Tachycardia, unspecified: Secondary | ICD-10-CM | POA: Diagnosis present

## 2016-10-08 DIAGNOSIS — Z8619 Personal history of other infectious and parasitic diseases: Secondary | ICD-10-CM | POA: Diagnosis not present

## 2016-10-08 DIAGNOSIS — A403 Sepsis due to Streptococcus pneumoniae: Secondary | ICD-10-CM | POA: Diagnosis not present

## 2016-10-08 DIAGNOSIS — I959 Hypotension, unspecified: Secondary | ICD-10-CM | POA: Diagnosis present

## 2016-10-08 DIAGNOSIS — A401 Sepsis due to streptococcus, group B: Secondary | ICD-10-CM | POA: Diagnosis not present

## 2016-10-08 DIAGNOSIS — J154 Pneumonia due to other streptococci: Secondary | ICD-10-CM | POA: Diagnosis present

## 2016-10-08 DIAGNOSIS — Z720 Tobacco use: Secondary | ICD-10-CM | POA: Diagnosis not present

## 2016-10-08 DIAGNOSIS — J181 Lobar pneumonia, unspecified organism: Secondary | ICD-10-CM | POA: Diagnosis not present

## 2016-10-08 DIAGNOSIS — A419 Sepsis, unspecified organism: Secondary | ICD-10-CM | POA: Diagnosis not present

## 2016-10-08 DIAGNOSIS — M545 Low back pain: Secondary | ICD-10-CM | POA: Diagnosis present

## 2016-10-08 DIAGNOSIS — L409 Psoriasis, unspecified: Secondary | ICD-10-CM | POA: Diagnosis present

## 2016-10-08 DIAGNOSIS — Z9851 Tubal ligation status: Secondary | ICD-10-CM | POA: Diagnosis not present

## 2016-10-08 DIAGNOSIS — N12 Tubulo-interstitial nephritis, not specified as acute or chronic: Secondary | ICD-10-CM | POA: Diagnosis present

## 2016-10-08 DIAGNOSIS — R05 Cough: Secondary | ICD-10-CM | POA: Diagnosis not present

## 2016-10-08 DIAGNOSIS — R509 Fever, unspecified: Secondary | ICD-10-CM | POA: Diagnosis not present

## 2016-10-08 DIAGNOSIS — E876 Hypokalemia: Secondary | ICD-10-CM | POA: Diagnosis present

## 2016-10-08 DIAGNOSIS — J9601 Acute respiratory failure with hypoxia: Secondary | ICD-10-CM | POA: Diagnosis not present

## 2016-10-08 DIAGNOSIS — M546 Pain in thoracic spine: Secondary | ICD-10-CM | POA: Diagnosis not present

## 2016-10-08 DIAGNOSIS — F17210 Nicotine dependence, cigarettes, uncomplicated: Secondary | ICD-10-CM | POA: Diagnosis present

## 2016-10-08 LAB — CBC
HEMATOCRIT: 31.2 % — AB (ref 36.0–46.0)
HEMATOCRIT: 30.9 % — AB (ref 36.0–46.0)
HEMOGLOBIN: 10.3 g/dL — AB (ref 12.0–15.0)
HEMOGLOBIN: 10.5 g/dL — AB (ref 12.0–15.0)
MCH: 32.6 pg (ref 26.0–34.0)
MCH: 34 pg (ref 26.0–34.0)
MCHC: 33 g/dL (ref 30.0–36.0)
MCHC: 34 g/dL (ref 30.0–36.0)
MCV: 98.7 fL (ref 78.0–100.0)
MCV: 100 fL (ref 78.0–100.0)
Platelets: 151 10*3/uL (ref 150–400)
Platelets: 148 10*3/uL — ABNORMAL LOW (ref 150–400)
RBC: 3.16 MIL/uL — AB (ref 3.87–5.11)
RBC: 3.09 MIL/uL — AB (ref 3.87–5.11)
RDW: 12.7 % (ref 11.5–15.5)
RDW: 13 % (ref 11.5–15.5)
WBC: 25.3 10*3/uL — ABNORMAL HIGH (ref 4.0–10.5)
WBC: 25.8 10*3/uL — ABNORMAL HIGH (ref 4.0–10.5)

## 2016-10-08 LAB — BASIC METABOLIC PANEL
Anion gap: 7 (ref 5–15)
BUN: 10 mg/dL (ref 6–20)
CHLORIDE: 104 mmol/L (ref 101–111)
CO2: 23 mmol/L (ref 22–32)
CREATININE: 0.72 mg/dL (ref 0.44–1.00)
Calcium: 7.7 mg/dL — ABNORMAL LOW (ref 8.9–10.3)
GFR calc Af Amer: >60 mL/min (ref >60)
GFR calc non Af Amer: >60 mL/min (ref >60)
GLUCOSE: 126 mg/dL — AB (ref 65–99)
POTASSIUM: 3.7 mmol/L (ref 3.5–5.1)
SODIUM: 134 mmol/L — AB (ref 135–145)

## 2016-10-08 LAB — PROCALCITONIN: PROCALCITONIN: 3.71 ng/mL

## 2016-10-08 LAB — HIV ANTIBODY (ROUTINE TESTING W REFLEX): HIV Screen 4th Generation wRfx: NONREACTIVE

## 2016-10-08 LAB — LACTIC ACID, PLASMA: Lactic Acid, Venous: 1.2 mmol/L (ref 0.5–1.9)

## 2016-10-08 LAB — STREP PNEUMONIAE URINARY ANTIGEN: Strep Pneumo Urinary Antigen: POSITIVE — AB

## 2016-10-08 MED ORDER — MORPHINE SULFATE (PF) 4 MG/ML IV SOLN
4.0000 mg | INTRAVENOUS | Status: DC | PRN
  Administered 2016-10-08 – 2016-10-10 (×10): 4 mg via INTRAVENOUS
  Filled 2016-10-08 (×10): qty 1

## 2016-10-08 MED ORDER — LEVOFLOXACIN IN D5W 500 MG/100ML IV SOLN
500.0000 mg | INTRAVENOUS | Status: DC
  Administered 2016-10-08 – 2016-10-10 (×3): 500 mg via INTRAVENOUS
  Filled 2016-10-08 (×3): qty 100

## 2016-10-08 MED ORDER — NICOTINE 14 MG/24HR TD PT24
14.0000 mg | MEDICATED_PATCH | Freq: Every day | TRANSDERMAL | Status: DC
  Administered 2016-10-08 – 2016-10-10 (×3): 14 mg via TRANSDERMAL
  Filled 2016-10-08 (×3): qty 1

## 2016-10-08 MED ORDER — OXYCODONE HCL 5 MG PO TABS
5.0000 mg | ORAL_TABLET | ORAL | Status: DC | PRN
  Administered 2016-10-08 – 2016-10-11 (×14): 5 mg via ORAL
  Filled 2016-10-08 (×15): qty 1

## 2016-10-08 MED ORDER — ONDANSETRON HCL 4 MG/2ML IJ SOLN
4.0000 mg | Freq: Four times a day (QID) | INTRAMUSCULAR | Status: DC | PRN
  Administered 2016-10-08 – 2016-10-11 (×7): 4 mg via INTRAVENOUS
  Filled 2016-10-08 (×7): qty 2

## 2016-10-08 NOTE — Progress Notes (Signed)
PROGRESS NOTE  LESLE FARON WUJ:[id number] DOB: 10/17/1970 DOA: 10/07/2016 PCP: No PCP Per Patient  HPI/Recap of past 24 hours: Patient is a 54 you female with past medical history of heavy tobacco use and psoriasis presented to the emergency room on 3/23 with 5 days of bilateral flank pain. She came into the emergency room because in the last 24 hours her pain and worsening describes a 10 out of 10. In the emergency room, she was noted to be hypotensive with a temp of 103 as well as some increased urinary frequency.  Her urinalysis noted large leukocytes, but otherwise was clear. She did not complain of cough or shortness of breath. She underwent a CT scan of abdomen and pelvis which noted normal kidneys although did note some gallbladder wall thickening, but did note extensive emphysematous changes in lungs and signs of right lower lobe pneumonia. Lactic acid level on admission normal, but patient hypotensive.  This morning, patient's white count is unchanged. Strep pneumo antigen positive. No further fevers since admission. Patient herself still continues to feel rough although does not really complain of shortness of breath. Her biggest complaint is of pain with deep inspiration bilateral quadrants of her abdomen.  Assessment/Plan: Principal Problem:   Sepsis secondary to Community acquired strep pneumonia of right lower lobe of lung [facility]): Patient meets criteria for sepsis on admission given elevated pro calcitonin level, hypotension, tachycardia, leukocytosis and pulmonary source. Given positive strep pneumo, discontinue Zithromax and added Levaquin.  She does not make much improvement in the next 24 hours, will consider CT scan.  Incentive spirometry.  Gallbladder wall thickening: Incidental finding. I do not think that her pain is related to GI process given that she has pain on both upper quadrants and not affected in the least by eating. Active Problems:    Tobacco abuse: Nicotine  patch. Counseled patient, special with emphysematous changes seen in her lung, she really needs to quit.  History of psoriasis: Stable Code Status: Full code   Family Communication: Daughter is at the bedside   Disposition Plan: Likely will be here for several more days    Consultants:  None   Procedures:  None   Antimicrobials: IV Levaquin 3/24-present IV Zithromax 3/23-3/24 IV Rocephin 3/23-present  DVT prophylaxis:  Lovenox   Objective: Vitals:   10/07/16 2309 10/08/16 0441 10/08/16 0500 10/08/16 0516  BP: (!) 95/55 91/63    Pulse: (!) 105 80    Resp: 17 18    Temp: 99.5 F (37.5 C)  98 F (36.7 C) 98.3 F (36.8 C)  TempSrc: Oral  Oral   SpO2: 100% 99%    Weight:      Height:        Intake/Output Summary (Last 24 hours) at 10/08/16 1527 Last data filed at 10/08/16 1000  Gross per 24 hour  Intake          9804.58 ml  Output                0 ml  Net          9804.58 ml   Filed Weights   10/07/16 1832 10/07/16 2304  Weight: 54 kg (119 lb) 51.3 kg (113 lb)    Exam:   General:  Alert and oriented 3, no acute distress  Cardiovascular: regular rate and rhythm, S1-S2   Respiratory: decreased breath sounds bibasilar   Abdomen: soft, some tenderness in both right and left upper quadrants, nondistended, positive bowel sounds  Musculoskeletal: No clubbing or cyanosis or edema  Skin: No skin breaks, tears or lesions  Psychiatry: patient is appropriate, no evidence of psychoses     Data Reviewed: CBC:  Recent Labs Lab 10/07/16 1939 10/08/16 0520 10/08/16 1407  WBC 21.5* 25.3* 25.8*  NEUTROABS 18.6*  --   --   HGB 12.6 10.3* 10.5*  HCT 37.3 31.2* 30.9*  MCV 98.4 98.7 100.0  PLT 177 151 148*   Basic Metabolic Panel:  Recent Labs Lab 10/07/16 1939 10/08/16 0520  NA 133* 134*  K 3.0* 3.7  CL 99* 104  CO2 23 23  GLUCOSE 98 126*  BUN 8 10  CREATININE 0.73 0.72  CALCIUM 8.9 7.7*   GFR: Estimated Creatinine Clearance: 72.7 mL/min  (by C-G formula based on SCr of 0.72 mg/dL). Liver Function Tests:  Recent Labs Lab 10/07/16 1939  AST 71*  ALT 54  ALKPHOS 96  BILITOT 1.1  PROT 9.1*  ALBUMIN 4.0    Recent Labs Lab 10/07/16 1939  LIPASE 16   No results for input(s): AMMONIA in the last 168 hours. Coagulation Profile: No results for input(s): INR, PROTIME in the last 168 hours. Cardiac Enzymes: No results for input(s): CKTOTAL, CKMB, CKMBINDEX, TROPONINI in the last 168 hours. BNP (last 3 results) No results for input(s): PROBNP in the last 8760 hours. HbA1C: No results for input(s): HGBA1C in the last 72 hours. CBG: No results for input(s): GLUCAP in the last 168 hours. Lipid Profile: No results for input(s): CHOL, HDL, LDLCALC, TRIG, CHOLHDL, LDLDIRECT in the last 72 hours. Thyroid Function Tests: No results for input(s): TSH, T4TOTAL, FREET4, T3FREE, THYROIDAB in the last 72 hours. Anemia Panel: No results for input(s): VITAMINB12, FOLATE, FERRITIN, TIBC, IRON, RETICCTPCT in the last 72 hours. Urine analysis:    Component Value Date/Time   COLORURINE YELLOW 10/07/2016 1959   APPEARANCEUR CLEAR 10/07/2016 1959   LABSPEC 1.008 10/07/2016 1959   PHURINE 6.0 10/07/2016 1959   GLUCOSEU NEGATIVE 10/07/2016 1959   HGBUR NEGATIVE 10/07/2016 1959   BILIRUBINUR NEGATIVE 10/07/2016 1959   KETONESUR NEGATIVE 10/07/2016 1959   PROTEINUR NEGATIVE 10/07/2016 1959   UROBILINOGEN 4.0 (H) 10/11/2007 1325   NITRITE NEGATIVE 10/07/2016 1959   LEUKOCYTESUR LARGE (A) 10/07/2016 1959   Sepsis Labs: @LABRCNTIP (procalcitonin:4,lacticidven:4)  )No results found for this or any previous visit (from the past 240 hour(s)).    Studies: Ct Abdomen Pelvis Wo Contrast  Result Date: 10/07/2016 CLINICAL DATA:  Low back pain with fever EXAM: CT ABDOMEN AND PELVIS WITHOUT CONTRAST TECHNIQUE: Multidetector CT imaging of the abdomen and pelvis was performed following the standard protocol without IV contrast. COMPARISON:   None. FINDINGS: Lower chest: Mild emphysematous disease. Dense area of consolidation in the right lower lobe with small peripheral nodules. No pleural effusion. Normal heart size. Hepatobiliary: No focal hepatic abnormality. No biliary dilatation. No calcified gallstones. Possible mild gallbladder wall thickening. Pancreas: Unremarkable. No pancreatic ductal dilatation or surrounding inflammatory changes. Spleen: Normal in size without focal abnormality. Adrenals/Urinary Tract: Slight fullness of the right renal collecting system in ureter but no calcified stones. This is likely secondary to bladder distention. Bladder demonstrates normal wall thickness. Left kidney within normal limits. Adrenal glands are unremarkable. Stomach/Bowel: Stomach is within normal limits. Appendix appears normal. No evidence of bowel wall thickening, distention, or inflammatory changes. Vascular/Lymphatic: No significant vascular findings are present. No enlarged abdominal or pelvic lymph nodes. Reproductive: Bilateral tubal ligation clips.  No adnexal masses. Other: No free air or free fluid. Musculoskeletal:  No acute or significant osseous findings. IMPRESSION: 1. Mild emphysematous disease at the lung bases. Dense consolidation in the right lower lobe consistent with a pneumonia. Small nodules at the periphery of the consolidation are likely inflammatory/infectious. 2. Possible mild wall thickening of the gallbladder. Correlation with ultrasound may be obtained as clinically indicated 3. Slight fullness of the right renal collecting system and ureter, but no evidence for ureteral calculi. Electronically Signed   By: Jasmine PangKim  Fujinaga M.D.   On: 10/07/2016 21:30   Dg Chest 2 View  Result Date: 10/07/2016 CLINICAL DATA:  Fever, cough. EXAM: CHEST  2 VIEW COMPARISON:  None. FINDINGS: The heart size and mediastinal contours are within normal limits. No pneumothorax or pleural effusion is noted. Left lung is clear. Right lower lobe  airspace opacity is noted concerning for pneumonia. The visualized skeletal structures are unremarkable. IMPRESSION: Probable right lower lobe pneumonia. Followup PA and lateral chest X-ray is recommended in 3-4 weeks following trial of antibiotic therapy to ensure resolution and exclude underlying malignancy. Electronically Signed   By: Lupita RaiderJames  Green Jr, M.D.   On: 10/07/2016 19:32    Scheduled Meds: . azithromycin  500 mg Intravenous Q24H  . cefTRIAXone (ROCEPHIN)  IV  1 g Intravenous Q24H  . enoxaparin (LOVENOX) injection  40 mg Subcutaneous QHS  . ibuprofen  800 mg Oral Once  . nicotine  14 mg Transdermal Daily  . pneumococcal 23 valent vaccine  0.5 mL Intramuscular Tomorrow-1000    Continuous Infusions: . sodium chloride 1,000 mL (10/08/16 1400)     LOS: 0 days    Hollice EspyKRISHNAN,Kinesha Auten K, MD [facility] Pager [phone]  If 7PM-7AM, please contact night-coverage [email or web address] Password [email or web address] 10/08/2016, 3:27 PM

## 2016-10-09 ENCOUNTER — Inpatient Hospital Stay (HOSPITAL_COMMUNITY): Payer: Medicaid Other

## 2016-10-09 DIAGNOSIS — J9601 Acute respiratory failure with hypoxia: Secondary | ICD-10-CM

## 2016-10-09 DIAGNOSIS — M546 Pain in thoracic spine: Secondary | ICD-10-CM

## 2016-10-09 LAB — CBC
HCT: 30.9 % — ABNORMAL LOW (ref 36.0–46.0)
HEMOGLOBIN: 10.4 g/dL — AB (ref 12.0–15.0)
MCH: 33.9 pg (ref 26.0–34.0)
MCHC: 33.7 g/dL (ref 30.0–36.0)
MCV: 100.7 fL — ABNORMAL HIGH (ref 78.0–100.0)
PLATELETS: 141 10*3/uL — AB (ref 150–400)
RBC: 3.07 MIL/uL — AB (ref 3.87–5.11)
RDW: 13.1 % (ref 11.5–15.5)
WBC: 16.9 10*3/uL — AB (ref 4.0–10.5)

## 2016-10-09 LAB — EXPECTORATED SPUTUM ASSESSMENT W GRAM STAIN, RFLX TO RESP C

## 2016-10-09 LAB — URINE CULTURE

## 2016-10-09 LAB — EXPECTORATED SPUTUM ASSESSMENT W REFEX TO RESP CULTURE

## 2016-10-09 NOTE — Progress Notes (Signed)
PROGRESS NOTE  Isabella Powell ZOX:[id number] DOB: 09/30/1970 DOA: 10/07/2016 PCP: No PCP Per Patient  HPI/Recap of past 24 hours: Patient is a 32 you female with past medical history of heavy tobacco use and psoriasis presented to the emergency room on 3/23 with 5 days of bilateral flank pain. She came into the emergency room because in the last 24 hours her pain and worsening describes a 10 out of 10. In the emergency room, she was noted to be hypotensive with a temp of 103 as well as some increased urinary frequency.  Her urinalysis noted large leukocytes, but otherwise was clear. She did not complain of cough or shortness of breath. She underwent a CT scan of abdomen and pelvis which noted normal kidneys although did note some gallbladder wall thickening, but did note extensive emphysematous changes in lungs and signs of right lower lobe pneumonia. Lactic acid level on admission normal, but patient hypotensive.  Strep pneumo antigen positive on 3/24 afternoon. Antibiotics adjusted.  Overnight, patient spiked another fever. However this morning, white blood cell count significantly down to 15. Patient himself states that she's overall final bit better. Saw having some pain which she says is now more feels in the lower back middle of her spine. Chest complains of some pain with deep inspiration.  Assessment/Plan: Principal Problem:   Sepsis secondary to Community acquired strep pneumonia of right lower lobe of lung [facility]): Patient meets criteria for sepsis on admission given elevated pro calcitonin level, hypotension, tachycardia, leukocytosis and pulmonary source. Given positive strep pneumo, discontinued Zithromax and added Levaquin.  Responding to incentive spirometry. If she spikes another fever, will go ahead and check a CT scan of her chest.  Gallbladder wall thickening: Incidental finding. I do not think that her pain is related to GI process given that she has pain on both upper quadrants  and not affected in the least by eating. Active Problems:    Tobacco abuse: Nicotine patch. Counseled patient, special with emphysematous changes seen in her lung, she really needs to quit.  Back pain: Check thoracic x-ray. Pain is now much lower than where her lungs are  History of psoriasis: Stable Code Status: Full code   Family Communication: One daughter sleeping at the bedside  Disposition Plan: Potential discharge tomorrow if she continues to have big improvements, otherwise Tuesday.   Consultants:  None   Procedures:  None   Antimicrobials: IV Levaquin 3/24-present IV Zithromax 3/23-3/24 IV Rocephin 3/23-present  DVT prophylaxis:  Lovenox   Objective: Vitals:   10/08/16 2133 10/09/16 0038 10/09/16 0412 10/09/16 0702  BP:   115/71   Pulse:   87   Resp:   20   Temp: (!) 101.9 F (38.8 C) 99.4 F (37.4 C) (!) 101.3 F (38.5 C) 99.6 F (37.6 C)  TempSrc: Oral Oral Oral Oral  SpO2:   99%   Weight:      Height:        Intake/Output Summary (Last 24 hours) at 10/09/16 1223 Last data filed at 10/09/16 0200  Gross per 24 hour  Intake             2550 ml  Output                0 ml  Net             2550 ml   Filed Weights   10/07/16 1832 10/07/16 2304  Weight: 54 kg (119 lb) 51.3 kg (113 lb)  Exam:   General:  Alert and oriented 3, no acute distress  Cardiovascular: regular rate and rhythm, S1-S2   Respiratory: decreased breath sounds Right base   Abdomen: soft, some tenderness in both right and left upper quadrants, nondistended, positive bowel sounds   Musculoskeletal: No clubbing or cyanosis or edema.  Patient has some tenderness in her lower back around 51T11 old with a spinal area.  Skin: No skin breaks, tears or lesions  Psychiatry: patient is appropriate, no evidence of psychoses     Data Reviewed: CBC:  Recent Labs Lab 10/07/16 1939 10/08/16 0520 10/08/16 1407 10/09/16 0442  WBC 21.5* 25.3* 25.8* 16.9*  NEUTROABS 18.6*   --   --   --   HGB 12.6 10.3* 10.5* 10.4*  HCT 37.3 31.2* 30.9* 30.9*  MCV 98.4 98.7 100.0 100.7*  PLT 177 151 148* 141*   Basic Metabolic Panel:  Recent Labs Lab 10/07/16 1939 10/08/16 0520  NA 133* 134*  K 3.0* 3.7  CL 99* 104  CO2 23 23  GLUCOSE 98 126*  BUN 8 10  CREATININE 0.73 0.72  CALCIUM 8.9 7.7*   GFR: Estimated Creatinine Clearance: 72.7 mL/min (by C-G formula based on SCr of 0.72 mg/dL). Liver Function Tests:  Recent Labs Lab 10/07/16 1939  AST 71*  ALT 54  ALKPHOS 96  BILITOT 1.1  PROT 9.1*  ALBUMIN 4.0    Recent Labs Lab 10/07/16 1939  LIPASE 16   No results for input(s): AMMONIA in the last 168 hours. Coagulation Profile: No results for input(s): INR, PROTIME in the last 168 hours. Cardiac Enzymes: No results for input(s): CKTOTAL, CKMB, CKMBINDEX, TROPONINI in the last 168 hours. BNP (last 3 results) No results for input(s): PROBNP in the last 8760 hours. HbA1C: No results for input(s): HGBA1C in the last 72 hours. CBG: No results for input(s): GLUCAP in the last 168 hours. Lipid Profile: No results for input(s): CHOL, HDL, LDLCALC, TRIG, CHOLHDL, LDLDIRECT in the last 72 hours. Thyroid Function Tests: No results for input(s): TSH, T4TOTAL, FREET4, T3FREE, THYROIDAB in the last 72 hours. Anemia Panel: No results for input(s): VITAMINB12, FOLATE, FERRITIN, TIBC, IRON, RETICCTPCT in the last 72 hours. Urine analysis:    Component Value Date/Time   COLORURINE YELLOW 10/07/2016 1959   APPEARANCEUR CLEAR 10/07/2016 1959   LABSPEC 1.008 10/07/2016 1959   PHURINE 6.0 10/07/2016 1959   GLUCOSEU NEGATIVE 10/07/2016 1959   HGBUR NEGATIVE 10/07/2016 1959   BILIRUBINUR NEGATIVE 10/07/2016 1959   KETONESUR NEGATIVE 10/07/2016 1959   PROTEINUR NEGATIVE 10/07/2016 1959   UROBILINOGEN 4.0 (H) 10/11/2007 1325   NITRITE NEGATIVE 10/07/2016 1959   LEUKOCYTESUR LARGE (A) 10/07/2016 1959   Sepsis  Labs: @LABRCNTIP (procalcitonin:4,lacticidven:4)  ) Recent Results (from the past 240 hour(s))  Urine culture     Status: Abnormal   Collection Time: 10/07/16  7:59 PM  Result Value Ref Range Status   Specimen Description URINE, RANDOM  Final   Special Requests NONE  Final   Culture MULTIPLE SPECIES PRESENT, SUGGEST RECOLLECTION (A)  Final   Report Status 10/09/2016 FINAL  Final      Studies: No results found.  Scheduled Meds: . cefTRIAXone (ROCEPHIN)  IV  1 g Intravenous Q24H  . enoxaparin (LOVENOX) injection  40 mg Subcutaneous QHS  . ibuprofen  800 mg Oral Once  . levofloxacin (LEVAQUIN) IV  500 mg Intravenous Q24H  . nicotine  14 mg Transdermal Daily  . pneumococcal 23 valent vaccine  0.5 mL Intramuscular Tomorrow-1000  Continuous Infusions: . sodium chloride 1,000 mL (10/08/16 2252)     LOS: 1 day    Hollice Espy, MD [facility] Pager [phone]  If 7PM-7AM, please contact night-coverage [email or web address] Password [facility] 10/09/2016, 12:23 PM

## 2016-10-10 DIAGNOSIS — R51 Headache: Secondary | ICD-10-CM

## 2016-10-10 LAB — URINALYSIS, ROUTINE W REFLEX MICROSCOPIC
Bilirubin Urine: NEGATIVE
Glucose, UA: NEGATIVE mg/dL
HGB URINE DIPSTICK: NEGATIVE
Ketones, ur: NEGATIVE mg/dL
NITRITE: NEGATIVE
PH: 7 (ref 5.0–8.0)
Protein, ur: NEGATIVE mg/dL
SPECIFIC GRAVITY, URINE: 1.005 (ref 1.005–1.030)

## 2016-10-10 LAB — CBC
HEMATOCRIT: 29.7 % — AB (ref 36.0–46.0)
Hemoglobin: 10 g/dL — ABNORMAL LOW (ref 12.0–15.0)
MCH: 33.7 pg (ref 26.0–34.0)
MCHC: 33.7 g/dL (ref 30.0–36.0)
MCV: 100 fL (ref 78.0–100.0)
Platelets: 153 10*3/uL (ref 150–400)
RBC: 2.97 MIL/uL — ABNORMAL LOW (ref 3.87–5.11)
RDW: 12.8 % (ref 11.5–15.5)
WBC: 7.9 10*3/uL (ref 4.0–10.5)

## 2016-10-10 LAB — EXPECTORATED SPUTUM ASSESSMENT W REFEX TO RESP CULTURE

## 2016-10-10 LAB — BRAIN NATRIURETIC PEPTIDE: B NATRIURETIC PEPTIDE 5: 292 pg/mL — AB (ref 0.0–100.0)

## 2016-10-10 LAB — PROCALCITONIN: PROCALCITONIN: 1.65 ng/mL

## 2016-10-10 LAB — EXPECTORATED SPUTUM ASSESSMENT W GRAM STAIN, RFLX TO RESP C

## 2016-10-10 MED ORDER — KETOROLAC TROMETHAMINE 15 MG/ML IJ SOLN: 15.0000 mg | Freq: Three times a day (TID) | INTRAMUSCULAR | Status: DC | PRN

## 2016-10-10 MED ORDER — SODIUM CHLORIDE 0.9 % IV SOLN
1000.0000 mL | INTRAVENOUS | Status: DC
  Administered 2016-10-10: 1000 mL via INTRAVENOUS

## 2016-10-10 MED ORDER — FLUCONAZOLE 100 MG PO TABS
150.0000 mg | ORAL_TABLET | Freq: Once | ORAL | Status: AC
Start: 2016-10-10 — End: 2016-10-10
  Administered 2016-10-10: 150 mg via ORAL
  Filled 2016-10-10: qty 2

## 2016-10-10 NOTE — Progress Notes (Signed)
PROGRESS NOTE  Isabella ChromanRicetta C Wild EAV:[id number] DOB: [birth year] DOA: 10/07/2016 PCP: No PCP Per Patient  HPI/Recap of past 24 hours: Patient is a [age] you female with past medical history of heavy tobacco use and psoriasis presented to the emergency room on 3/23 with 5 days of bilateral flank pain. She came into the emergency room because in the last 24 hours her pain and worsening describes a 10 out of 10. In the emergency room, she was noted to be hypotensive with a temp of 103 as well as some increased urinary frequency.  Her urinalysis noted large leukocytes, but otherwise was clear. She did not complain of cough or shortness of breath. She underwent a CT scan of abdomen and pelvis which noted normal kidneys although did note some gallbladder wall thickening, but did note extensive emphysematous changes in lungs and signs of right lower lobe pneumonia. Lactic acid level on admission normal, but patient hypotensive.  Strep pneumo antigen positive on 3/24 afternoon. Antibiotics adjusted.  Over the next few days, patient responded to antibiotics and white count has since normalized. Pro calcitonin level slowly trending downward. She spiked a fever on night of 3/24 and low-grade temp on night of 3/25. Patient states she still feels very rough, weak. She is complaining of some lower back pain on her thoracic spine and x-ray done 3/25 normal. Back pain better on the patient's biggest complaint is of headache.  Assessment/Plan: Principal Problem:   Sepsis secondary to Community acquired strep pneumonia of right lower lobe of lung [facility](HCC): Patient meets criteria for sepsis on admission given elevated pro calcitonin level, hypotension, tachycardia, leukocytosis and pulmonary source. Given positive strep pneumo, discontinued Zithromax and added Levaquin.  Responding to incentive spirometry. She is clearly responding to antibiotics and doing better with that. However, given some continued lower back tenderness and  fever spikes, we'll repeat UA.  Gallbladder wall thickening: Incidental finding. I do not think that her pain is related to GI process given that she has pain on both upper quadrants and not affected in the least by eating. Active Problems:  Headache: I do not think that she is having sinus pain as this is more perhaps rebound from hypotension and narcotics. We'll try some IV Toradol.    Tobacco abuse: Nicotine patch. Counseled patient, special with emphysematous changes seen in her lung, she really needs to quit.  Back pain: Thoracic spine x-ray unremarkable  History of psoriasis: Stable Code Status: Full code   Family Communication: Left message with daughter  Disposition Plan: Likely discharge tomorrow   Consultants:  None   Procedures:  None   Antimicrobials: IV Levaquin 3/24-present IV Zithromax 3/23-3/24 IV Rocephin 3/23-present  DVT prophylaxis:  Lovenox   Objective: Vitals:   10/09/16 0702 10/09/16 1452 10/09/16 2214 10/10/16 0519  BP:  107/73 103/73 (!) 104/56  Pulse:  86 81 82  Resp:  20 20 20   Temp: 99.6 F (37.6 C) 98.6 F (37 C) 100 F (37.8 C) 99 F (37.2 C)  TempSrc: Oral Oral Oral Oral  SpO2:  100% 100% 100%  Weight:      Height:        Intake/Output Summary (Last 24 hours) at 10/10/16 1155 Last data filed at 10/10/16 0600  Gross per 24 hour  Intake             3130 ml  Output                0 ml  Net  3130 ml   Filed Weights   10/07/16 1832 10/07/16 2304  Weight: 54 kg (119 lb) 51.3 kg (113 lb)    Exam:   General:  Alert and oriented 3, Mild distress secondary to headache and cough  Cardiovascular: regular rate and rhythm, S1-S2   Respiratory: decreased breath sounds Right base , although better airway exchange  Abdomen: soft, some tenderness in both right and left upper quadrants, nondistended, positive bowel sounds   Musculoskeletal: No clubbing or cyanosis or edema.  Patient has less tenderness in her lower  back around T11 on mid spine  Skin: No skin breaks, tears or lesions  Psychiatry: patient is appropriate, no evidence of psychoses     Data Reviewed: CBC:  Recent Labs Lab 10/07/16 1939 10/08/16 0520 10/08/16 1407 10/09/16 0442 10/10/16 0530  WBC 21.5* 25.3* 25.8* 16.9* 7.9  NEUTROABS 18.6*  --   --   --   --   HGB 12.6 10.3* 10.5* 10.4* 10.0*  HCT 37.3 31.2* 30.9* 30.9* 29.7*  MCV 98.4 98.7 100.0 100.7* 100.0  PLT 177 151 148* 141* 153   Basic Metabolic Panel:  Recent Labs Lab 10/07/16 1939 10/08/16 0520  NA 133* 134*  K 3.0* 3.7  CL 99* 104  CO2 23 23  GLUCOSE 98 126*  BUN 8 10  CREATININE 0.73 0.72  CALCIUM 8.9 7.7*   GFR: Estimated Creatinine Clearance: 72.7 mL/min (by C-G formula based on SCr of 0.72 mg/dL). Liver Function Tests:  Recent Labs Lab 10/07/16 1939  AST 71*  ALT 54  ALKPHOS 96  BILITOT 1.1  PROT 9.1*  ALBUMIN 4.0    Recent Labs Lab 10/07/16 1939  LIPASE 16   No results for input(s): AMMONIA in the last 168 hours. Coagulation Profile: No results for input(s): INR, PROTIME in the last 168 hours. Cardiac Enzymes: No results for input(s): CKTOTAL, CKMB, CKMBINDEX, TROPONINI in the last 168 hours. BNP (last 3 results) No results for input(s): PROBNP in the last 8760 hours. HbA1C: No results for input(s): HGBA1C in the last 72 hours. CBG: No results for input(s): GLUCAP in the last 168 hours. Lipid Profile: No results for input(s): CHOL, HDL, LDLCALC, TRIG, CHOLHDL, LDLDIRECT in the last 72 hours. Thyroid Function Tests: No results for input(s): TSH, T4TOTAL, FREET4, T3FREE, THYROIDAB in the last 72 hours. Anemia Panel: No results for input(s): VITAMINB12, FOLATE, FERRITIN, TIBC, IRON, RETICCTPCT in the last 72 hours. Urine analysis:    Component Value Date/Time   COLORURINE YELLOW 10/07/2016 1959   APPEARANCEUR CLEAR 10/07/2016 1959   LABSPEC 1.008 10/07/2016 1959   PHURINE 6.0 10/07/2016 1959   GLUCOSEU NEGATIVE  10/07/2016 1959   HGBUR NEGATIVE 10/07/2016 1959   BILIRUBINUR NEGATIVE 10/07/2016 1959   KETONESUR NEGATIVE 10/07/2016 1959   PROTEINUR NEGATIVE 10/07/2016 1959   UROBILINOGEN 4.0 (H) 10/11/2007 1325   NITRITE NEGATIVE 10/07/2016 1959   LEUKOCYTESUR LARGE (A) 10/07/2016 1959   Sepsis Labs: @LABRCNTIP (procalcitonin:4,lacticidven:4)  ) Recent Results (from the past 240 hour(s))  Blood Culture (routine x 2)     Status: None (Preliminary result)   Collection Time: 10/07/16  7:44 PM  Result Value Ref Range Status   Specimen Description BLOOD RIGHT ANTECUBITAL  Final   Special Requests BOTTLES DRAWN AEROBIC AND ANAEROBIC 5CC  Final   Culture   Final    NO GROWTH 1 DAY Performed at [facility], [address], [place], [place] [address]    Report Status PENDING  Incomplete  Blood Culture (routine  x 2)     Status: None (Preliminary result)   Collection Time: 10/07/16  7:44 PM  Result Value Ref Range Status   Specimen Description BLOOD LEFT ARM  Final   Special Requests BOTTLES DRAWN AEROBIC AND ANAEROBIC 5CC  Final   Culture   Final    NO GROWTH 1 DAY Performed at [facility], [address], [place], [place] [address]    Report Status PENDING  Incomplete  Urine culture     Status: Abnormal   Collection Time: 10/07/16  7:59 PM  Result Value Ref Range Status   Specimen Description URINE, RANDOM  Final   Special Requests NONE  Final   Culture MULTIPLE SPECIES PRESENT, SUGGEST RECOLLECTION (A)  Final   Report Status 10/09/2016 FINAL  Final  Culture, sputum-assessment     Status: None   Collection Time: 10/09/16 12:01 PM  Result Value Ref Range Status   Specimen Description SPUTUM  Final   Special Requests NONE  Final   Sputum evaluation   Final    Sputum specimen not acceptable for testing.  Please recollect.   NOTIFIED DIANE AT 1230 ON [id number] BY HOOKER,B    Report Status 10/09/2016 FINAL  Final  Culture, expectorated sputum-assessment     Status: None    Collection Time: 10/10/16  8:51 AM  Result Value Ref Range Status   Specimen Description EXPECTORATED SPUTUM  Final   Special Requests NONE  Final   Sputum evaluation THIS SPECIMEN IS ACCEPTABLE FOR SPUTUM CULTURE  Final   Report Status 10/10/2016 FINAL  Final      Studies: Dg Thoracic Spine 2 View  Result Date: 10/09/2016 CLINICAL DATA:  Bilateral flank pain for 1 week, no known injury EXAM: THORACIC SPINE 2 VIEWS COMPARISON:  10/07/2016 FINDINGS: Three views of thoracic spine submitted. Mild lower thoracic dextroscoliosis. Persistent streaky infiltrate/pneumonia right base medially. No acute fracture or subluxation. Disc spaces and vertebral body heights are preserved. IMPRESSION: No acute fracture or subluxation. Mild lower thoracic dextroscoliosis. Persistent streaky infiltrate right base medially medially. Electronically Signed   By: Natasha Mead M.D.   On: 10/09/2016 14:48    Scheduled Meds: . cefTRIAXone (ROCEPHIN)  IV  1 g Intravenous Q24H  . enoxaparin (LOVENOX) injection  40 mg Subcutaneous QHS  . ibuprofen  800 mg Oral Once  . levofloxacin (LEVAQUIN) IV  500 mg Intravenous Q24H  . nicotine  14 mg Transdermal Daily  . pneumococcal 23 valent vaccine  0.5 mL Intramuscular Tomorrow-1000    Continuous Infusions: . sodium chloride 1,000 mL (10/10/16 0939)     LOS: 2 days    Hollice Espy, MD [facility] Pager [phone]  If 7PM-7AM, please contact night-coverage [email or web address] Password [facility] 10/10/2016, 11:55 AM

## 2016-10-10 NOTE — Progress Notes (Signed)
Pt nauseated and vomited small amoount of yellow green emess, held diflucan for now and deferred to latrer in shift. SRP RN

## 2016-10-10 NOTE — Progress Notes (Signed)
CSW notified by CM Cookie, that patient needed housing resources. CSW spoke with patient at bedside, CSW and patient discussed patient's current living environment. CSW provided patient with local housing resources and encouraged patient to follow up asap.Patient reported that she plans on following up and moving out of current residence. CSW signing off, please consult if new needs arise.   Celso SickleKimberly Zeyad Delaguila, [place] Clinical Social Worker [facility] Cell#: [phone]

## 2016-10-10 NOTE — Progress Notes (Signed)
C/O of vaginal itching and discharge, MD notified orders received. SRP, RN

## 2016-10-10 NOTE — Progress Notes (Signed)
Spoke with pt concerning discharge plans. Pt asked to speak with CSW related to being homeless. Cala BradfordKimberly, CSW aware. There are no hh needs at present time.

## 2016-10-11 ENCOUNTER — Telehealth: Payer: Self-pay

## 2016-10-11 DIAGNOSIS — A401 Sepsis due to streptococcus, group B: Secondary | ICD-10-CM

## 2016-10-11 MED ORDER — LEVOFLOXACIN 500 MG PO TABS: 500.0000 mg | ORAL_TABLET | Freq: Every day | ORAL | 0 refills | Status: DC

## 2016-10-11 MED ORDER — CEFDINIR 300 MG PO CAPS
300.0000 mg | ORAL_CAPSULE | Freq: Two times a day (BID) | ORAL | 0 refills | Status: DC
Start: 2016-10-11 — End: 2021-10-04

## 2016-10-11 NOTE — Progress Notes (Signed)
CSW contacted by patient's RN, who reported that patient needed assistance with transportation, noting patient reported there was no bus stop near her home. CSW spoke with patient at bedside regarding transportation. Patient reported that she does not have any family/friends to provide transportation. CSW encouraged patient to continue reaching out to family/friends, patient made call in CSW presence and received no answer. CSW provided patient with taxi voucher to be transported home.    Celso SickleKimberly Maricsa Sammons, [place] Clinical Social Worker [facility] Cell#: [phone]

## 2016-10-11 NOTE — Care Management Note (Signed)
Case Management Note  Patient Details  Name: Isabella Powell MRN: [id number] Date of Birth: 1970-10-27  Subjective/Objective:Received referral for pcp f/u appt. Spoke to [facility]-transitional Care Nurse-they do not have appts currently-patient will have to call office after d/c to set up hospital f/u appt-patient voiced understanding.Taxi voucher-CSW managing.No further CM needs.                    Action/Plan:d/c home.   Expected Discharge Date:  10/11/16               Expected Discharge Plan:  Home/Self Care  In-House Referral:  Clinical Social Work, PCP / Management consultantHealth Connect  Discharge planning Services  CM Consult  Post Acute Care Choice:  NA Choice offered to:  NA  DME Arranged:    DME Agency:     HH Arranged:    HH Agency:     Status of Service:  Completed, signed off  If discussed at [employer] Meetings, dates discussed:    Additional Comments:  Lanier ClamMahabir, Isabella Smits, RN 10/11/2016, 12:33 PM

## 2016-10-11 NOTE — Progress Notes (Signed)
Completed D/ C teaching. Gave note and taxi voucher. Patient will be sent home via taxi.

## 2016-10-11 NOTE — Discharge Summary (Addendum)
Discharge Summary  Isabella Powell ZOX:[id number] DOB: 11-Apr-1971  PCP: No PCP Per Patient / patient getting referral for community and wellness Center when available.  Admit date: 10/07/2016 Discharge date: 10/11/2016  Time spent: 25 minutes   Recommendations for Outpatient Follow-up:  1. New medication: Levaquin 500 mg by mouth daily 3 days  2. New medication: Omnicef 300 mg by mouth twice a day 3 days  Discharge Diagnoses:  Active Hospital Problems   Diagnosis Date Noted  . Community acquired pneumonia of right lower lobe of lung (HCC) 10/07/2016  . Tobacco abuse 10/08/2016  . Sepsis (HCC) 10/07/2016    Resolved Hospital Problems   Diagnosis Date Noted Date Resolved  No resolved problems to display.    Discharge Condition: Improved, being discharged home   Diet recommendation: Regular diet   Vitals:   10/10/16 2020 10/11/16 0513  BP: 111/73 (!) 97/54  Pulse: 68 73  Resp: 19 18  Temp: 98 F (36.7 C) 98.7 F (37.1 C)    History of present illness:   Patient is a 32 you female with past medical history of heavy tobacco use and psoriasis presented to the emergency room on 3/23 with 5 days of bilateral flank pain. She came into the emergency room because in the last 24 hours her pain and worsening describes a 10 out of 10. In the emergency room, she was noted to be hypotensive with a temp of 103 as well as some increased urinary frequency.  Her urinalysis noted large leukocytes, but otherwise was clear. She did not complain of cough or shortness of breath. She underwent a CT scan of abdomen and pelvis which noted normal kidneys although did note some gallbladder wall thickening, but did note extensive emphysematous changes in lungs and signs of right lower lobe pneumonia. Lactic acid level on admission normal, but patient hypotensive.  Hospital Course:    Sepsis secondary to Community acquired strep pneumonia of right lower lobe of lung [facility]): Patient meets criteria for  sepsis on admission given elevated pro calcitonin level, hypotension, tachycardia, leukocytosis and pulmonary source. On 3/24, strep pneumo antigen positive.  discontinued Zithromax and added Levaquin.  Responding to incentive spirometry. Over the next few days, patient's white count continued to improve. She had a few fever spikes as sometimes can be seen with strep pneumonia. By 3/27, ambulating in hall on room air, breathing comfortably and white count normal with no fever 24 hours. Discharged on 3 more days of Levaquin for seven-day course.  Gallbladder wall thickening: Incidental finding. I do not think that her pain is related to GI process given that she has pain on both upper quadrants and not affected in the least by eating. Active Problems:  Headache: Patient was complaining of a headache which was persistent starting 3/25. Initially this was thought to possibly be rebound from narcotics. However she discontinued her nicotine patch on 3/26 and started to feel better.    Tobacco abuse: Initially treated with nicotine patch. Discontinued on 3/26 because it was causing her headaches. Counseled patient, special with emphysematous changes seen in her lung, she really needs to quit. After hearing about her lungs, she seems quite encouraged to do so.  Back pain: Patient was complaining of some lower back pain along her thoracic spine. Thoracic spine x-ray unremarkable. Felt better after a few days.   Procedures:  None   Consultations:  None   Discharge Exam: BP (!) 97/54 (BP Location: Left Arm)   Pulse 73   Temp  98.7 F (37.1 C) (Oral)   Resp 18   Ht 5\' 8"  (1.727 m)   Wt 51.3 kg (113 lb)   SpO2 99%   BMI 17.18 kg/m   General: Alert and oriented 3, no acute distress  Cardiovascular: Regular rate and rhythm, S1-S2  Respiratory: Clear to auscultation bilaterally   Discharge Instructions You were cared for by a hospitalist during your hospital stay. If you have any questions  about your discharge medications or the care you received while you were in the hospital after you are discharged, you can call the unit and asked to speak with the hospitalist on call if the hospitalist that took care of you is not available. Once you are discharged, your primary care physician will handle any further medical issues. Please note that NO REFILLS for any discharge medications will be authorized once you are discharged, as it is imperative that you return to your primary care physician (or establish a relationship with a primary care physician if you do not have one) for your aftercare needs so that they can reassess your need for medications and monitor your lab values.  Discharge Instructions    Diet - low sodium heart healthy    Complete by:  As directed    Increase activity slowly    Complete by:  As directed      Allergies as of 10/11/2016   No Known Allergies     Medication List    TAKE these medications   cefdinir 300 MG capsule Commonly known as:  OMNICEF Take 1 capsule (300 mg total) by mouth 2 (two) times daily.   GOODY HEADACHE PO Take 1 packet by mouth every 4 (four) hours as needed (pain).   levofloxacin 500 MG tablet Commonly known as:  LEVAQUIN Take 1 tablet (500 mg total) by mouth daily.   SLEEP AID PO Take 1-3 tablets by mouth at bedtime as needed (sleep).      No Known Allergies Follow-up Information    [facility]. Schedule an appointment as soon as possible for a visit.   Contact information: [address] [place] [id number] [phone]           The results of significant diagnostics from this hospitalization (including imaging, microbiology, ancillary and laboratory) are listed below for reference.    Significant Diagnostic Studies: Ct Abdomen Pelvis Wo Contrast  Result Date: 10/07/2016 CLINICAL DATA:  Low back pain with fever EXAM: CT ABDOMEN AND PELVIS WITHOUT CONTRAST  TECHNIQUE: Multidetector CT imaging of the abdomen and pelvis was performed following the standard protocol without IV contrast. COMPARISON:  None. FINDINGS: Lower chest: Mild emphysematous disease. Dense area of consolidation in the right lower lobe with small peripheral nodules. No pleural effusion. Normal heart size. Hepatobiliary: No focal hepatic abnormality. No biliary dilatation. No calcified gallstones. Possible mild gallbladder wall thickening. Pancreas: Unremarkable. No pancreatic ductal dilatation or surrounding inflammatory changes. Spleen: Normal in size without focal abnormality. Adrenals/Urinary Tract: Slight fullness of the right renal collecting system in ureter but no calcified stones. This is likely secondary to bladder distention. Bladder demonstrates normal wall thickness. Left kidney within normal limits. Adrenal glands are unremarkable. Stomach/Bowel: Stomach is within normal limits. Appendix appears normal. No evidence of bowel wall thickening, distention, or inflammatory changes. Vascular/Lymphatic: No significant vascular findings are present. No enlarged abdominal or pelvic lymph nodes. Reproductive: Bilateral tubal ligation clips.  No adnexal masses. Other: No free air or free fluid. Musculoskeletal: No acute  or significant osseous findings. IMPRESSION: 1. Mild emphysematous disease at the lung bases. Dense consolidation in the right lower lobe consistent with a pneumonia. Small nodules at the periphery of the consolidation are likely inflammatory/infectious. 2. Possible mild wall thickening of the gallbladder. Correlation with ultrasound may be obtained as clinically indicated 3. Slight fullness of the right renal collecting system and ureter, but no evidence for ureteral calculi. Electronically Signed   By: Jasmine Pang M.D.   On: 10/07/2016 21:30   Dg Chest 2 View  Result Date: 10/07/2016 CLINICAL DATA:  Fever, cough. EXAM: CHEST  2 VIEW COMPARISON:  None. FINDINGS: The heart size  and mediastinal contours are within normal limits. No pneumothorax or pleural effusion is noted. Left lung is clear. Right lower lobe airspace opacity is noted concerning for pneumonia. The visualized skeletal structures are unremarkable. IMPRESSION: Probable right lower lobe pneumonia. Followup PA and lateral chest X-ray is recommended in 3-4 weeks following trial of antibiotic therapy to ensure resolution and exclude underlying malignancy. Electronically Signed   By: Lupita Raider, M.D.   On: 10/07/2016 19:32   Dg Thoracic Spine 2 View  Result Date: 10/09/2016 CLINICAL DATA:  Bilateral flank pain for 1 week, no known injury EXAM: THORACIC SPINE 2 VIEWS COMPARISON:  10/07/2016 FINDINGS: Three views of thoracic spine submitted. Mild lower thoracic dextroscoliosis. Persistent streaky infiltrate/pneumonia right base medially. No acute fracture or subluxation. Disc spaces and vertebral body heights are preserved. IMPRESSION: No acute fracture or subluxation. Mild lower thoracic dextroscoliosis. Persistent streaky infiltrate right base medially medially. Electronically Signed   By: Natasha Mead M.D.   On: 10/09/2016 14:48    Microbiology: Recent Results (from the past 240 hour(s))  Blood Culture (routine x 2)     Status: None (Preliminary result)   Collection Time: 10/07/16  7:44 PM  Result Value Ref Range Status   Specimen Description BLOOD RIGHT ANTECUBITAL  Final   Special Requests BOTTLES DRAWN AEROBIC AND ANAEROBIC 5CC  Final   Culture   Final    NO GROWTH 2 DAYS Performed at [facility], [address] [place], [place] [address]    Report Status PENDING  Incomplete  Blood Culture (routine x 2)     Status: None (Preliminary result)   Collection Time: 10/07/16  7:44 PM  Result Value Ref Range Status   Specimen Description BLOOD LEFT ARM  Final   Special Requests BOTTLES DRAWN AEROBIC AND ANAEROBIC 5CC  Final   Culture   Final    NO GROWTH 2 DAYS Performed at [facility],  [address] [place], [place] [address]    Report Status PENDING  Incomplete  Urine culture     Status: Abnormal   Collection Time: 10/07/16  7:59 PM  Result Value Ref Range Status   Specimen Description URINE, RANDOM  Final   Special Requests NONE  Final   Culture MULTIPLE SPECIES PRESENT, SUGGEST RECOLLECTION (A)  Final   Report Status 10/09/2016 FINAL  Final  Culture, sputum-assessment     Status: None   Collection Time: 10/09/16 12:01 PM  Result Value Ref Range Status   Specimen Description SPUTUM  Final   Special Requests NONE  Final   Sputum evaluation   Final    Sputum specimen not acceptable for testing.  Please recollect.   NOTIFIED DIANE AT 1230 ON [id number] BY HOOKER,B    Report Status 10/09/2016 FINAL  Final  Culture, expectorated sputum-assessment     Status: None  Collection Time: 10/10/16  8:51 AM  Result Value Ref Range Status   Specimen Description EXPECTORATED SPUTUM  Final   Special Requests NONE  Final   Sputum evaluation THIS SPECIMEN IS ACCEPTABLE FOR SPUTUM CULTURE  Final   Report Status 10/10/2016 FINAL  Final  Culture, respiratory (NON-Expectorated)     Status: None (Preliminary result)   Collection Time: 10/10/16  8:51 AM  Result Value Ref Range Status   Specimen Description EXPECTORATED SPUTUM  Final   Special Requests NONE Reflexed from [id number]  Final   Gram Stain   Final    FEW WBC PRESENT, PREDOMINANTLY PMN RARE GRAM NEGATIVE RODS    Culture   Final    RARE YEAST IDENTIFICATION TO FOLLOW Performed at [facility], [address] [place], [place] [address]    Report Status PENDING  Incomplete     Labs: Basic Metabolic Panel:  Recent Labs Lab 10/07/16 1939 10/08/16 0520  NA 133* 134*  K 3.0* 3.7  CL 99* 104  CO2 23 23  GLUCOSE 98 126*  BUN 8 10  CREATININE 0.73 0.72  CALCIUM 8.9 7.7*   Liver Function Tests:  Recent Labs Lab 10/07/16 1939  AST 71*  ALT 54  ALKPHOS 96  BILITOT 1.1  PROT 9.1*  ALBUMIN 4.0    Recent  Labs Lab 10/07/16 1939  LIPASE 16   No results for input(s): AMMONIA in the last 168 hours. CBC:  Recent Labs Lab 10/07/16 1939 10/08/16 0520 10/08/16 1407 10/09/16 0442 10/10/16 0530  WBC 21.5* 25.3* 25.8* 16.9* 7.9  NEUTROABS 18.6*  --   --   --   --   HGB 12.6 10.3* 10.5* 10.4* 10.0*  HCT 37.3 31.2* 30.9* 30.9* 29.7*  MCV 98.4 98.7 100.0 100.7* 100.0  PLT 177 151 148* 141* 153   Cardiac Enzymes: No results for input(s): CKTOTAL, CKMB, CKMBINDEX, TROPONINI in the last 168 hours. BNP: BNP (last 3 results)  Recent Labs  10/10/16 0530  BNP 292.0*    ProBNP (last 3 results) No results for input(s): PROBNP in the last 8760 hours.  CBG: No results for input(s): GLUCAP in the last 168 hours.     Signed:  Hollice Espy, MD [facility] 10/11/2016, 1:41 PM

## 2016-10-11 NOTE — Telephone Encounter (Signed)
Message received from Lanier ClamKathy Mahabir, RN CM requesting a hospital follow up appointment for the patient at [facility]. Informed her that there are not any appointments available at this time and the patient will need to call the clinic to check on availability.

## 2016-10-12 LAB — CULTURE, RESPIRATORY

## 2016-10-12 LAB — CULTURE, RESPIRATORY W GRAM STAIN

## 2016-10-13 LAB — CULTURE, BLOOD (ROUTINE X 2)
CULTURE: NO GROWTH
Culture: NO GROWTH

## 2017-11-07 ENCOUNTER — Encounter (HOSPITAL_COMMUNITY): Payer: Self-pay

## 2017-11-07 ENCOUNTER — Other Ambulatory Visit: Payer: Self-pay

## 2017-11-07 ENCOUNTER — Emergency Department (HOSPITAL_COMMUNITY): Payer: Medicaid Other

## 2017-11-07 ENCOUNTER — Emergency Department (HOSPITAL_COMMUNITY)
Admission: EM | Admit: 2017-11-07 | Discharge: 2017-11-07 | Disposition: A | Discharge status: Home / Self Care | Payer: Medicaid Other | Attending: Emergency Medicine | Admitting: Emergency Medicine

## 2017-11-07 DIAGNOSIS — J181 Lobar pneumonia, unspecified organism: Secondary | ICD-10-CM | POA: Insufficient documentation

## 2017-11-07 DIAGNOSIS — J189 Pneumonia, unspecified organism: Secondary | ICD-10-CM

## 2017-11-07 DIAGNOSIS — F17210 Nicotine dependence, cigarettes, uncomplicated: Secondary | ICD-10-CM | POA: Insufficient documentation

## 2017-11-07 DIAGNOSIS — R05 Cough: Secondary | ICD-10-CM | POA: Diagnosis present

## 2017-11-07 DIAGNOSIS — R059 Cough, unspecified: Secondary | ICD-10-CM

## 2017-11-07 HISTORY — DX: Disorder of kidney and ureter, unspecified: N28.9

## 2017-11-07 LAB — URINALYSIS, ROUTINE W REFLEX MICROSCOPIC
BILIRUBIN URINE: NEGATIVE
Glucose, UA: NEGATIVE mg/dL
HGB URINE DIPSTICK: NEGATIVE
Ketones, ur: 80 mg/dL — AB
NITRITE: NEGATIVE
PROTEIN: NEGATIVE mg/dL
Specific Gravity, Urine: 1.009 (ref 1.005–1.030)
pH: 7 (ref 5.0–8.0)

## 2017-11-07 LAB — I-STAT BETA HCG BLOOD, ED (MC, WL, AP ONLY): I-stat hCG, quantitative: 22.1 m[IU]/mL — ABNORMAL HIGH (ref <5)

## 2017-11-07 LAB — CBC
HEMATOCRIT: 36.7 % (ref 36.0–46.0)
HEMOGLOBIN: 12.5 g/dL (ref 12.0–15.0)
MCH: 34 pg (ref 26.0–34.0)
MCHC: 34.1 g/dL (ref 30.0–36.0)
MCV: 99.7 fL (ref 78.0–100.0)
Platelets: 191 10*3/uL (ref 150–400)
RBC: 3.68 MIL/uL — AB (ref 3.87–5.11)
RDW: 12 % (ref 11.5–15.5)
WBC: 22.3 10*3/uL — ABNORMAL HIGH (ref 4.0–10.5)

## 2017-11-07 LAB — COMPREHENSIVE METABOLIC PANEL
ALBUMIN: 3.6 g/dL (ref 3.5–5.0)
ALT: 29 U/L (ref 14–54)
ANION GAP: 13 (ref 5–15)
AST: 33 U/L (ref 15–41)
Alkaline Phosphatase: 58 U/L (ref 38–126)
BUN: 5 mg/dL — ABNORMAL LOW (ref 6–20)
CO2: 22 mmol/L (ref 22–32)
Calcium: 9.2 mg/dL (ref 8.9–10.3)
Chloride: 99 mmol/L — ABNORMAL LOW (ref 101–111)
Creatinine, Ser: 0.64 mg/dL (ref 0.44–1.00)
GFR calc non Af Amer: >60 mL/min (ref >60)
Glucose, Bld: 105 mg/dL — ABNORMAL HIGH (ref 65–99)
POTASSIUM: 3.5 mmol/L (ref 3.5–5.1)
SODIUM: 134 mmol/L — AB (ref 135–145)
TOTAL PROTEIN: 7.9 g/dL (ref 6.5–8.1)
Total Bilirubin: 1.8 mg/dL — ABNORMAL HIGH (ref 0.3–1.2)

## 2017-11-07 LAB — PREGNANCY, URINE: Preg Test, Ur: NEGATIVE

## 2017-11-07 LAB — I-STAT CG4 LACTIC ACID, ED: Lactic Acid, Venous: 1.48 mmol/L (ref 0.5–1.9)

## 2017-11-07 LAB — LIPASE, BLOOD: Lipase: 22 U/L (ref 11–51)

## 2017-11-07 MED ORDER — FENTANYL CITRATE (PF) 100 MCG/2ML IJ SOLN
50.0000 ug | Freq: Once | INTRAMUSCULAR | Status: AC
  Administered 2017-11-07: 50 ug via INTRAVENOUS
  Filled 2017-11-07: qty 2

## 2017-11-07 MED ORDER — GI COCKTAIL ~~LOC~~
30.0000 mL | Freq: Once | ORAL | Status: AC
  Administered 2017-11-07: 30 mL via ORAL
  Filled 2017-11-07: qty 30

## 2017-11-07 MED ORDER — ONDANSETRON 4 MG PO TBDP: 4.0000 mg | ORAL_TABLET | Freq: Three times a day (TID) | ORAL | 0 refills | Status: AC | PRN

## 2017-11-07 MED ORDER — SODIUM CHLORIDE 0.9 % IV SOLN
500.0000 mg | INTRAVENOUS | Status: DC
  Administered 2017-11-07: 500 mg via INTRAVENOUS
  Filled 2017-11-07: qty 500

## 2017-11-07 MED ORDER — SODIUM CHLORIDE 0.9 % IV SOLN
2.0000 g | INTRAVENOUS | Status: DC
  Administered 2017-11-07: 2 g via INTRAVENOUS
  Filled 2017-11-07: qty 20

## 2017-11-07 MED ORDER — SODIUM CHLORIDE 0.9 % IV BOLUS
1000.0000 mL | Freq: Once | INTRAVENOUS | Status: AC
  Administered 2017-11-07: 1000 mL via INTRAVENOUS

## 2017-11-07 MED ORDER — AMOXICILLIN-POT CLAVULANATE 875-125 MG PO TABS: 2.0000 | ORAL_TABLET | Freq: Two times a day (BID) | ORAL | 0 refills | Status: AC

## 2017-11-07 MED ORDER — METOCLOPRAMIDE HCL 5 MG/ML IJ SOLN
10.0000 mg | Freq: Once | INTRAMUSCULAR | Status: AC
  Administered 2017-11-07: 10 mg via INTRAVENOUS
  Filled 2017-11-07: qty 2

## 2017-11-07 NOTE — ED Triage Notes (Signed)
Patient c/o left upper abdominal pain, vomiting and diarrhea x 3 days. Patient also c/o bilateral lower back pain. Patient denies any dysuria or urinary frequency.

## 2017-11-07 NOTE — ED Provider Notes (Signed)
[facility]EMERGENCY DEPT Provider Note  CSN: [id number] Arrival date & time: 11/07/17 4782  Chief Complaint(s) Abdominal Pain; Emesis; Diarrhea; and Back Pain  HPI Isabella Powell is a 47 y.o. female   The history is provided by the patient.  Abdominal Pain   This is a new problem. Episode onset: 4 days. The problem occurs constantly. Progression since onset: fluctuating. The pain is associated with an unknown factor. The pain is located in the epigastric region. The quality of the pain is aching and cramping. The pain is moderate. Associated symptoms include fever (102 TMAX), diarrhea, nausea and vomiting (NBNB). Pertinent negatives include hematochezia, melena, constipation and dysuria. The symptoms are aggravated by deep breathing, certain positions, palpation and vomiting. Nothing relieves the symptoms.   Endorses daily EtOH use. Last use was 10 hrs ago.  Patient reports that this is similar to prior pneumonia that she has had in the past.   Past Medical History Past Medical History:  Diagnosis Date  . Bartholin cyst   . Psoriasis-like skin disease   . Renal disorder   . Trichomonas    patient states that many years ago   Patient Active Problem List   Diagnosis Date Noted  . Tobacco abuse 10/08/2016  . Sepsis (HCC) 10/07/2016  . Acute lower UTI 10/07/2016  . Community acquired pneumonia of right lower lobe of lung (HCC) 10/07/2016   Home Medication(s) Prior to Admission medications   Medication Sig Start Date End Date Taking? Authorizing Provider  Naproxen Sodium (ALEVE) 220 MG CAPS Take 440 mg by mouth daily as needed (pain).   Yes [provider]  Pseudoephedrine-APAP-DM (TYLENOL COLD/FLU SEVERE DAY PO) Take 2 tablets by mouth daily as needed (flu symptoms).   Yes [provider]  amoxicillin-clavulanate (AUGMENTIN) 875-125 MG tablet Take 2 tablets by mouth every 12 (twelve) hours for 10 days. 11/07/17 11/17/17  Nira Conn, MD  cefdinir (OMNICEF) 300 MG capsule Take 1 capsule (300 mg total) by mouth 2 (two) times daily. Patient not taking: Reported on 11/07/2017 10/11/16   Hollice Espy, MD  levofloxacin (LEVAQUIN) 500 MG tablet Take 1 tablet (500 mg total) by mouth daily. Patient not taking: Reported on 11/07/2017 10/11/16   Hollice Espy, MD  ondansetron (ZOFRAN ODT) 4 MG disintegrating tablet Take 1 tablet (4 mg total) by mouth every 8 (eight) hours as needed for up to 3 days for nausea or vomiting. 11/07/17 11/10/17  Cardama, Amadeo Garnet, MD                                                                                                                                    Past Surgical History Past Surgical History:  Procedure Laterality Date  . CESAREAN SECTION    . TUBAL LIGATION     bilateral   Family History Family History  Problem Relation Age of Onset  . Cancer Father   .  Emphysema Father     Social History Social History   Tobacco Use  . Smoking status: Current Every Day Smoker    Packs/day: 0.50    Types: Cigarettes  . Smokeless tobacco: Never Used  Substance Use Topics  . Alcohol use: Yes    Comment: occassionally  . Drug use: Yes    Types: Marijuana    Comment: 2-3 times a week.   Allergies Patient has no known allergies.  Review of Systems Review of Systems  Constitutional: Positive for fever (102 TMAX).  Respiratory: Positive for cough. Negative for shortness of breath.   Gastrointestinal: Positive for abdominal pain, diarrhea, nausea and vomiting (NBNB). Negative for constipation, hematochezia and melena.  Genitourinary: Negative for dysuria.   All other systems are reviewed and are negative for acute change except as noted in the HPI  Physical Exam Vital Signs  I have reviewed the triage vital signs BP 105/70   Pulse 79   Temp 98 F (36.7 C) (Oral)   Resp 17   Ht 5\' 8"  (1.727 m)   Wt 54.4 kg (120 lb)   SpO2 100%   BMI 18.25 kg/m   Physical Exam   Constitutional: She is oriented to person, place, and time. She appears well-developed and well-nourished. No distress.  HENT:  Head: Normocephalic and atraumatic.  Nose: Nose normal.  Eyes: Pupils are equal, round, and reactive to light. Conjunctivae and EOM are normal. Right eye exhibits no discharge. Left eye exhibits no discharge. No scleral icterus.  Neck: Normal range of motion. Neck supple.  Cardiovascular: Normal rate and regular rhythm. Exam reveals no gallop and no friction rub.  No murmur heard. Pulmonary/Chest: Effort normal and breath sounds normal. No stridor. No respiratory distress. She has no rales.  Abdominal: Soft. She exhibits no distension. There is tenderness in the epigastric area and left upper quadrant. There is no rebound and no guarding.  Musculoskeletal: She exhibits no edema or tenderness.  Neurological: She is alert and oriented to person, place, and time.  Skin: Skin is warm and dry. No rash noted. She is not diaphoretic. No erythema.  Psychiatric: She has a normal mood and affect.  Vitals reviewed.   ED Results and Treatments Labs (all labs ordered are listed, but only abnormal results are displayed) Labs Reviewed  COMPREHENSIVE METABOLIC PANEL - Abnormal; Notable for the following components:      Result Value   Sodium 134 (*)    Chloride 99 (*)    Glucose, Bld 105 (*)    BUN 5 (*)    Total Bilirubin 1.8 (*)    All other components within normal limits  CBC - Abnormal; Notable for the following components:   WBC 22.3 (*)    RBC 3.68 (*)    All other components within normal limits  URINALYSIS, ROUTINE W REFLEX MICROSCOPIC - Abnormal; Notable for the following components:   Ketones, ur 80 (*)    Leukocytes, UA LARGE (*)    Bacteria, UA RARE (*)    All other components within normal limits  I-STAT BETA HCG BLOOD, ED ([facility], [facility], [facility] ONLY) - Abnormal; Notable for the following components:   I-stat hCG, quantitative 22.1 (*)    All other components  within normal limits  CULTURE, BLOOD (ROUTINE X 2)  CULTURE, BLOOD (ROUTINE X 2)  LIPASE, BLOOD  PREGNANCY, URINE  I-STAT CG4 LACTIC ACID, ED  EKG  EKG Interpretation  Date/Time:  Tuesday November 07 2017 15:22:29 EDT Ventricular Rate:  94 PR Interval:    QRS Duration: 75 QT Interval:  359 QTC Calculation: 449 R Axis:   81 Text Interpretation:  Sinus rhythm Nonspecific T wave abnormality, improved in Otherwise no significant change Confirmed by Drema Pryardama, Pedro [id number]) on 11/07/2017 3:25:58 PM      Radiology Dg Chest 2 View  Result Date: 11/07/2017 CLINICAL DATA:  Shortness of breath, cough, and congestion. Left upper abdominal pain, vomiting, and diarrhea for the past 3 days. Also bilateral low back pain. Current smoker. EXAM: CHEST - 2 VIEW COMPARISON:  Chest x-ray of October 07, 2016 FINDINGS: The lungs are mildly hyperinflated. There are patchy airspace opacities in the left lower lobe and lingula. There is no pleural effusion or pneumothorax. The heart and pulmonary vascularity are normal. IMPRESSION: Hyperinflation consistent with COPD. Superimposed lingular and left lower lobe pneumonia. Followup PA and lateral chest X-ray is recommended in 3-4 weeks following trial of antibiotic therapy to ensure resolution and exclude underlying malignancy. Electronically Signed   By: David  [place] M.D.   On: 11/07/2017 11:50   Pertinent labs & imaging results that were available during my care of the patient were reviewed by me and considered in my medical decision making (see chart for details).  Medications Ordered in ED Medications  cefTRIAXone (ROCEPHIN) 2 g in sodium chloride 0.9 % 100 mL IVPB (0 g Intravenous Stopped 11/07/17 1353)  azithromycin (ZITHROMAX) 500 mg in sodium chloride 0.9 % 250 mL IVPB (0 mg Intravenous Stopped 11/07/17 1522)  sodium chloride 0.9 % bolus 1,000 mL  (0 mLs Intravenous Stopped 11/07/17 1352)  metoCLOPramide (REGLAN) injection 10 mg (10 mg Intravenous Given 11/07/17 1230)  gi cocktail (Maalox,Lidocaine,Donnatal) (30 mLs Oral Given 11/07/17 1230)  fentaNYL (SUBLIMAZE) injection 50 mcg (50 mcg Intravenous Given 11/07/17 1246)                                                                                                                                    Procedures Procedures  (including critical care time)  Medical Decision Making / ED Course I have reviewed the nursing notes for this encounter and the patient's prior records (if available in EHR or on provided paperwork).    Work-up consistent with community-acquired pneumonia in the left lower lobe.  Labs are leukocytosis otherwise grossly reassuring.  UA without evidence of infection and negative.  No evidence of early obstruction or pancreatitis.  Patient was provided with symptomatic management and started on empiric antibiotics.  Examinations patient's abdomen was benign.  She was able to tolerate oral hydration and felt to be safe for outpatient management.  Final Clinical Impression(s) / ED Diagnoses Final diagnoses:  Cough  Community acquired pneumonia of left lower lobe of lung (HCC)    Disposition: Discharge  Condition: Good  I have discussed the results, Dx and Tx plan with the  patient who expressed understanding and agree(s) with the plan. Discharge instructions discussed at great length. The patient was given strict return precautions who verbalized understanding of the instructions. No further questions at time of discharge.    ED Discharge Orders        Ordered    amoxicillin-clavulanate (AUGMENTIN) 875-125 MG tablet  Every 12 hours     11/07/17 1550    ondansetron (ZOFRAN ODT) 4 MG disintegrating tablet  Every 8 hours PRN     11/07/17 1550       Follow Up: Primary care provider  Schedule an appointment as soon as possible for a visit  in 5-7 days, If symptoms  do not improve or  worsen     This chart was dictated using voice recognition software.  Despite best efforts to proofread,  errors can occur which can change the documentation meaning.   Nira Conn, MD 11/07/17 [phone]

## 2017-11-12 LAB — CULTURE, BLOOD (ROUTINE X 2)
CULTURE: NO GROWTH
Culture: NO GROWTH
Special Requests: ADEQUATE

## 2018-04-23 IMAGING — CT CT ABD-PELV W/O CM
2 of 4 series · 16 of 46 positions shown, 18 images · non-contrast
Comparison: None.

CLINICAL DATA: Low back pain with fever

EXAM:
CT ABDOMEN AND PELVIS WITHOUT CONTRAST
TECHNIQUE: Multidetector CT imaging of the abdomen and pelvis was performed
following the standard protocol without IV contrast.

[Series 2: abd/pel w/o · axial · non-contrast · 0.64mm/px · z∈[+1031,+1391]mm · 13 of 82 slices shown, 15 images]
[im 5/82  soft-tissue]
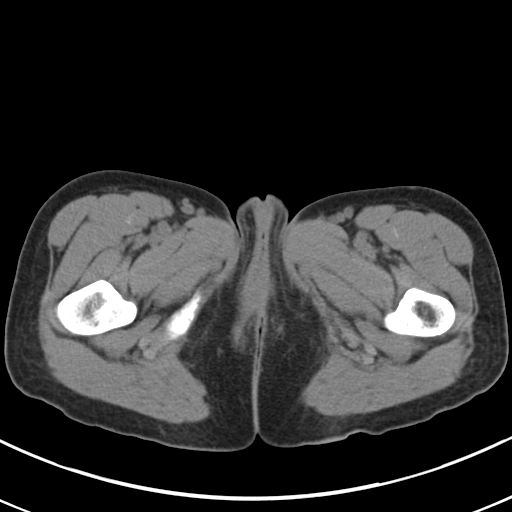
[im 5/82  bone]
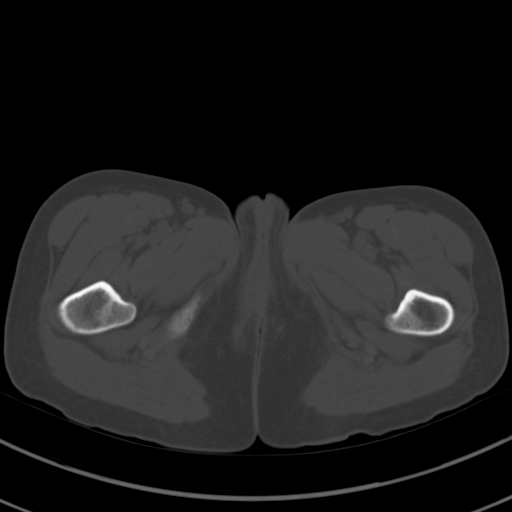
[im 13/82  soft-tissue]
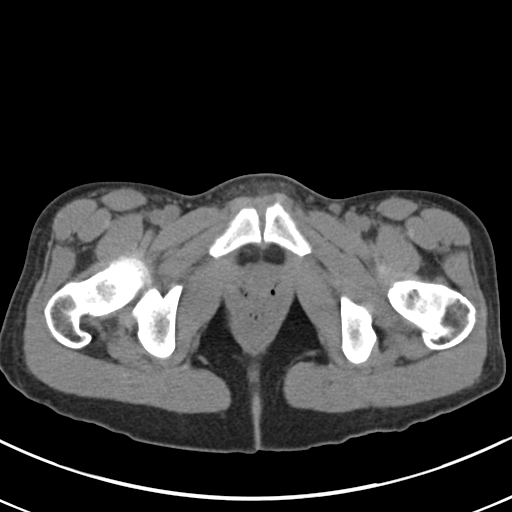
[im 18/82  soft-tissue]
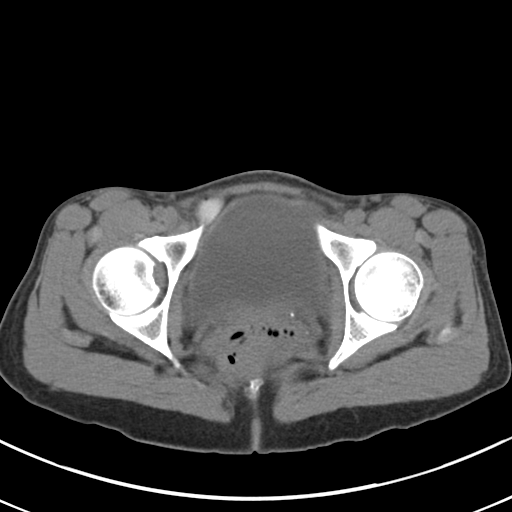
[im 22/82  soft-tissue]
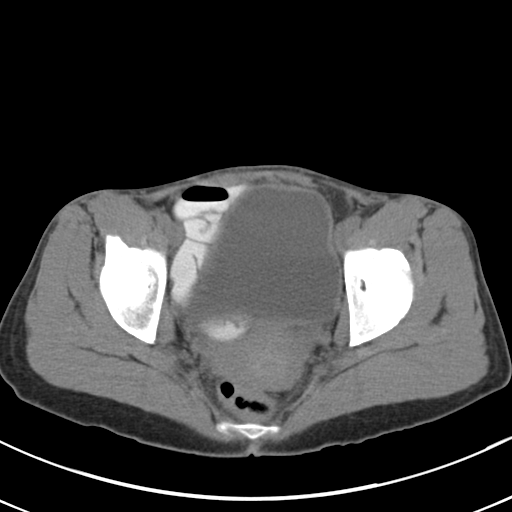
[im 30/82  soft-tissue]
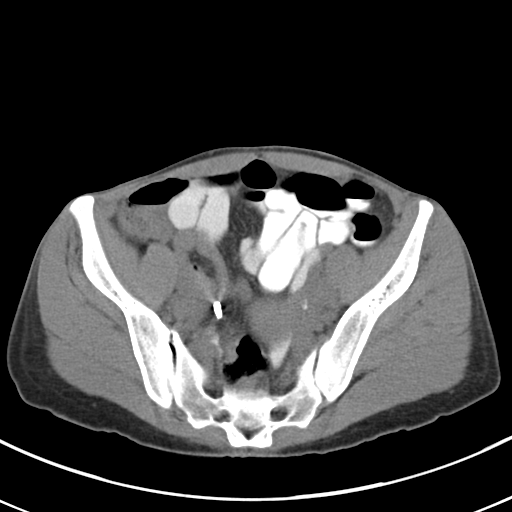
[im 35/82  soft-tissue]
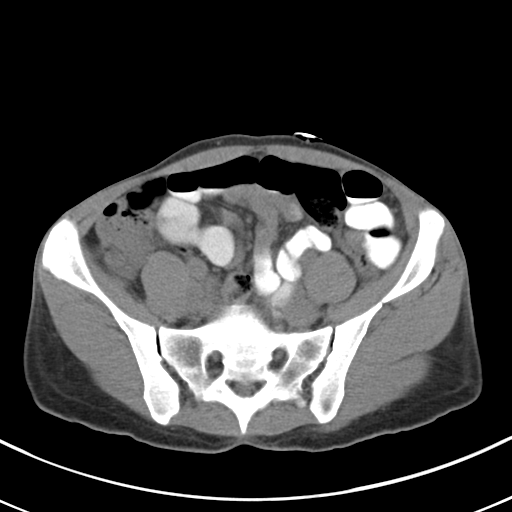
[im 43/82  soft-tissue]
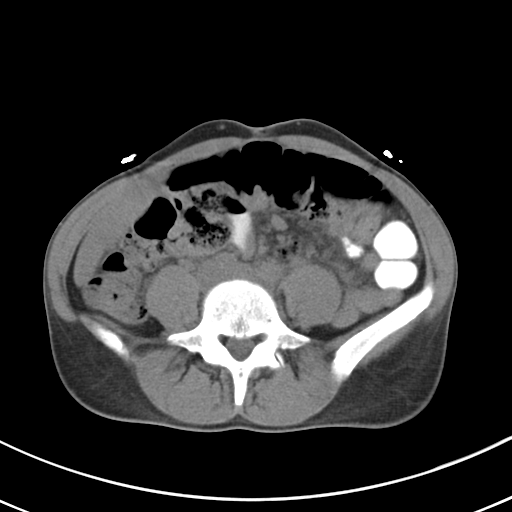
[im 47/82  soft-tissue]
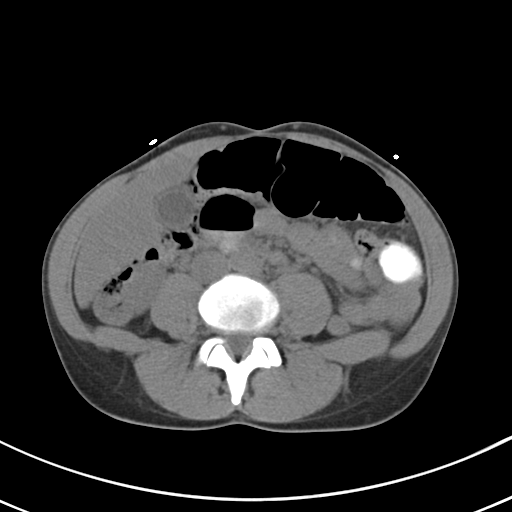
[im 52/82  soft-tissue]
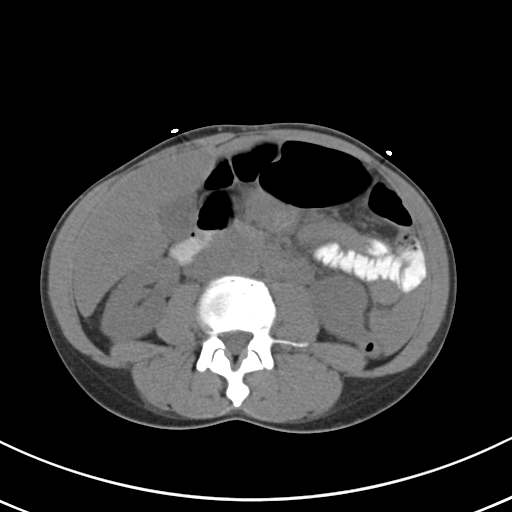
[im 52/82  bone]
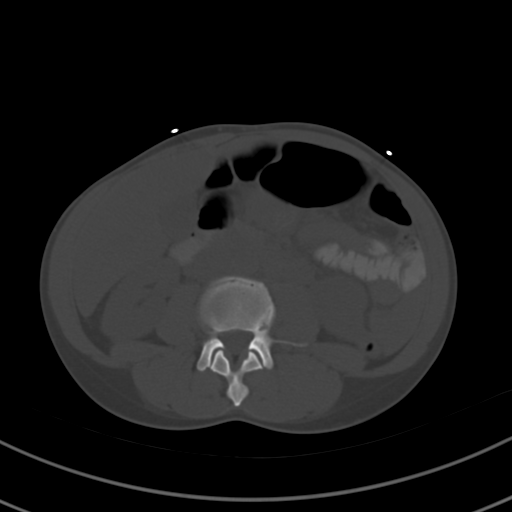
[im 60/82  soft-tissue]
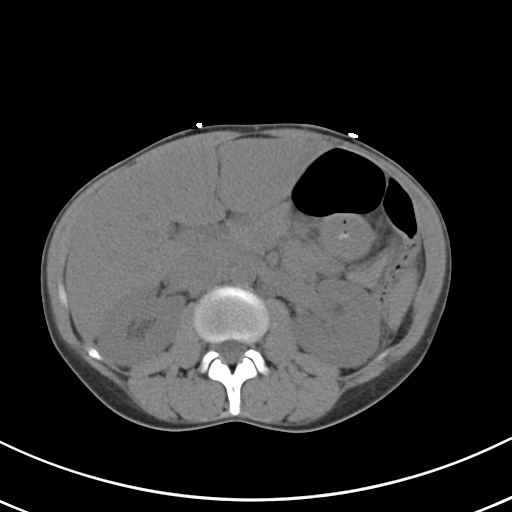
[im 64/82  soft-tissue]
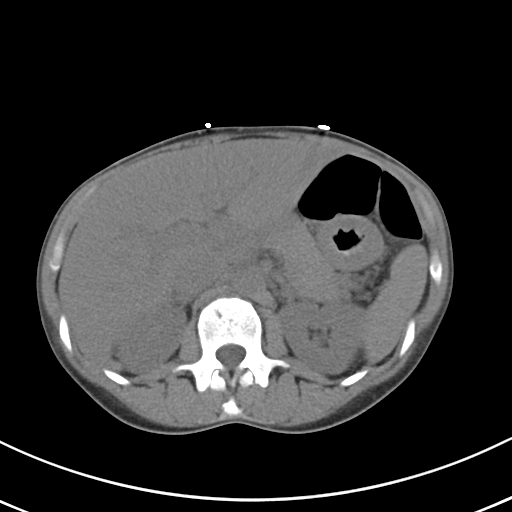
[im 69/82  soft-tissue]
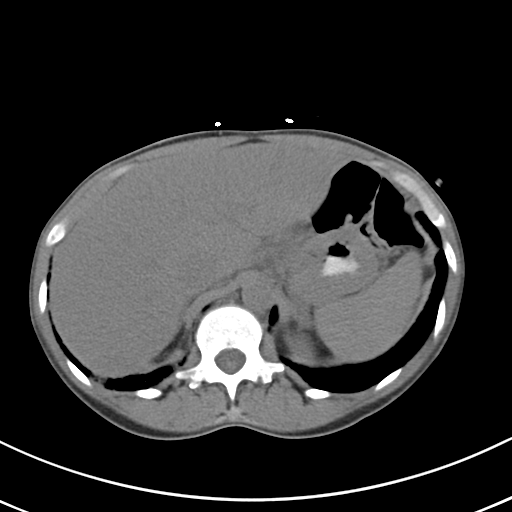
[im 77/82  soft-tissue]
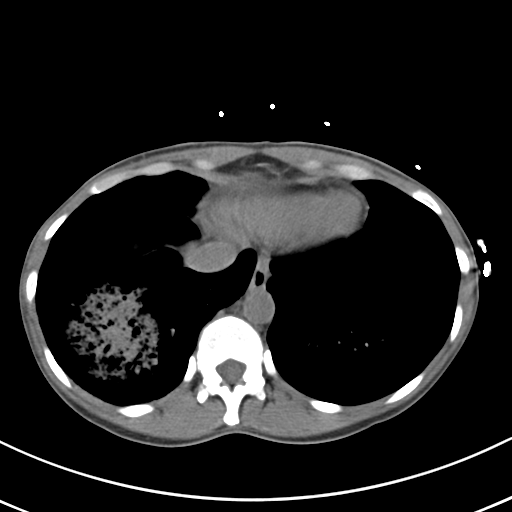

[Series 5: coronal · coronal · 0.62mm/px · 3 of 113 slices shown]
[im 38/113  soft-tissue]
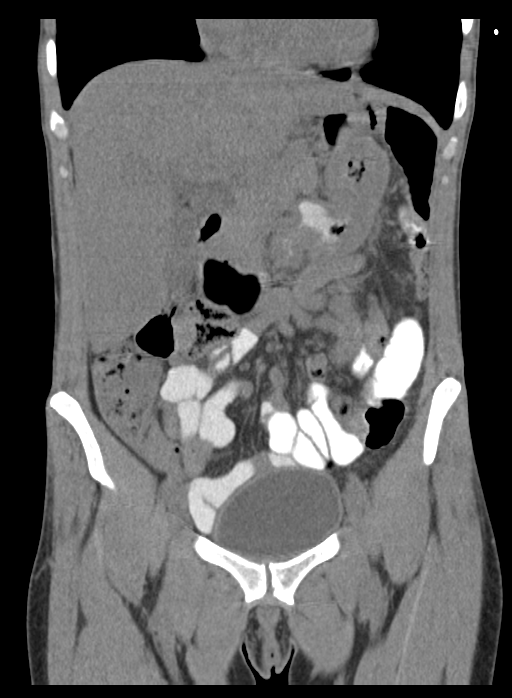
[im 50/113  soft-tissue]
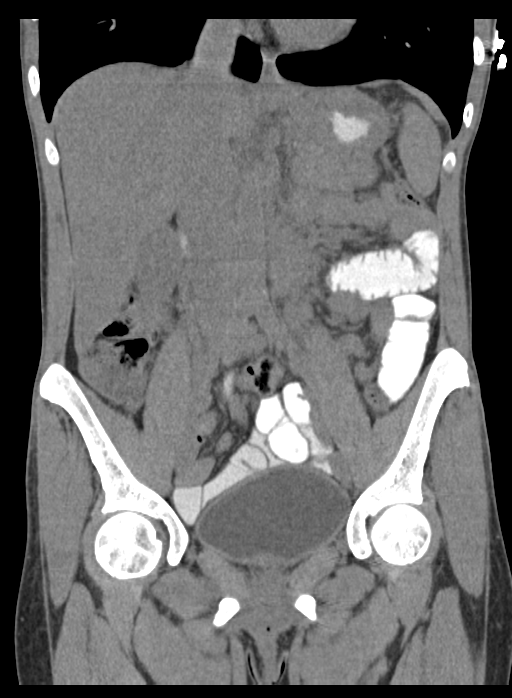
[im 63/113  soft-tissue]
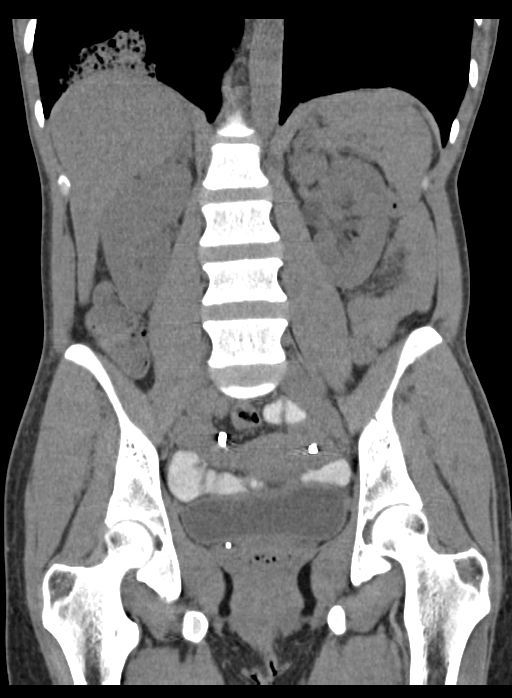

[16 of 46 positions shown; findings below may reference images not displayed]

FINDINGS: Lower chest: Mild emphysematous disease. Dense area of consolidation
in the right lower lobe with small peripheral nodules. No pleural
effusion. Normal heart size.

Hepatobiliary: No focal hepatic abnormality. No biliary dilatation.
No calcified gallstones. Possible mild gallbladder wall thickening.

Pancreas: Unremarkable. No pancreatic ductal dilatation or
surrounding inflammatory changes.

Spleen: Normal in size without focal abnormality.

Adrenals/Urinary Tract: Slight fullness of the right renal
collecting system in ureter but no calcified stones. This is likely
secondary to bladder distention. Bladder demonstrates normal wall
thickness. Left kidney within normal limits. Adrenal glands are
unremarkable.

Stomach/Bowel: Stomach is within normal limits. Appendix appears
normal. No evidence of bowel wall thickening, distention, or
inflammatory changes.

Vascular/Lymphatic: No significant vascular findings are present. No
enlarged abdominal or pelvic lymph nodes.

Reproductive: Bilateral tubal ligation clips.  No adnexal masses.

Other: No free air or free fluid.

Musculoskeletal: No acute or significant osseous findings.
IMPRESSION: 1. Mild emphysematous disease at the lung bases. Dense consolidation
in the right lower lobe consistent with a pneumonia. Small nodules
at the periphery of the consolidation are likely
inflammatory/infectious.
2. Possible mild wall thickening of the gallbladder. Correlation
with ultrasound may be obtained as clinically indicated
3. Slight fullness of the right renal collecting system and ureter,
but no evidence for ureteral calculi.

## 2019-05-24 IMAGING — CR DG CHEST 2V
2 series · 2 of 2 positions shown · non-contrast
Comparison: Chest x-ray of October 07, 2016

CLINICAL DATA: Shortness of breath, cough, and congestion. Left
upper abdominal pain, vomiting, and diarrhea for the past 3 days.
Also bilateral low back pain. Current smoker.

EXAM:
CHEST - 2 VIEW

[w chest pa]
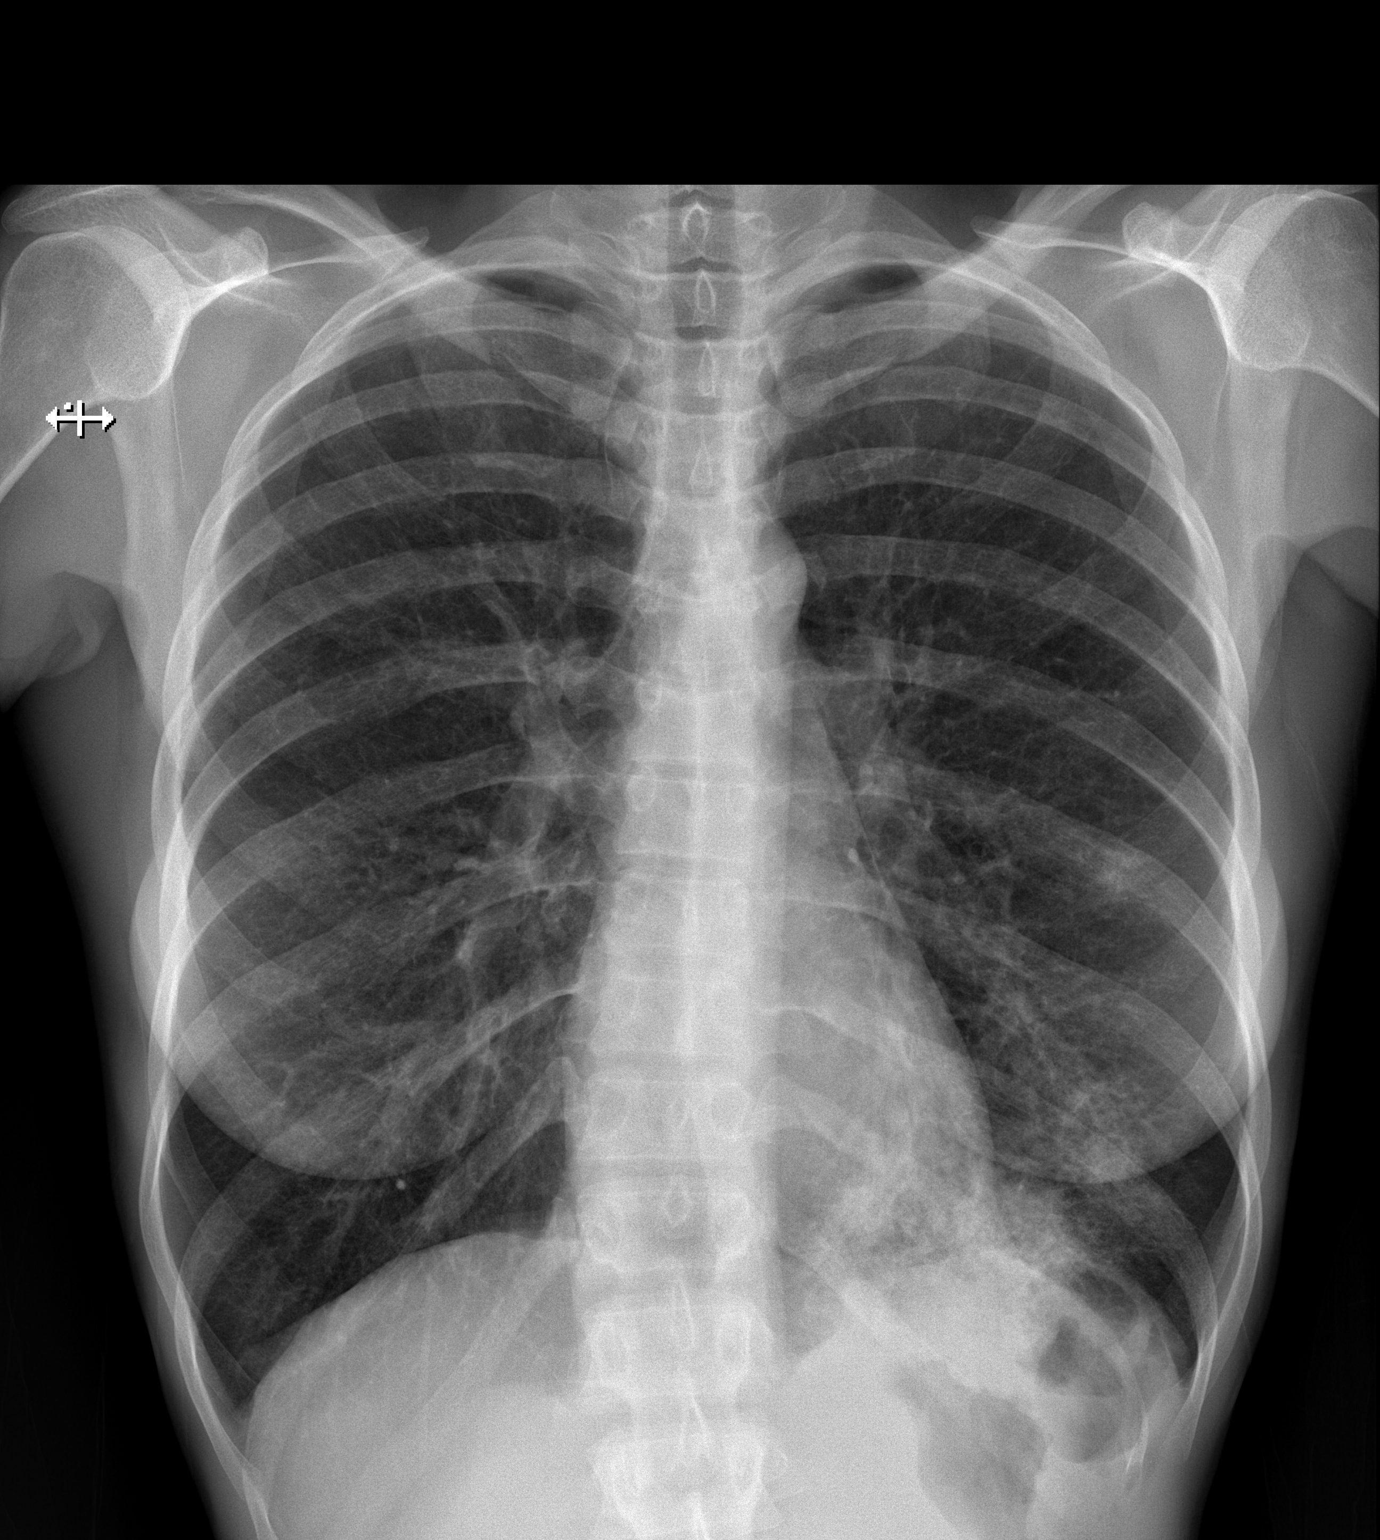

[w chest lat]
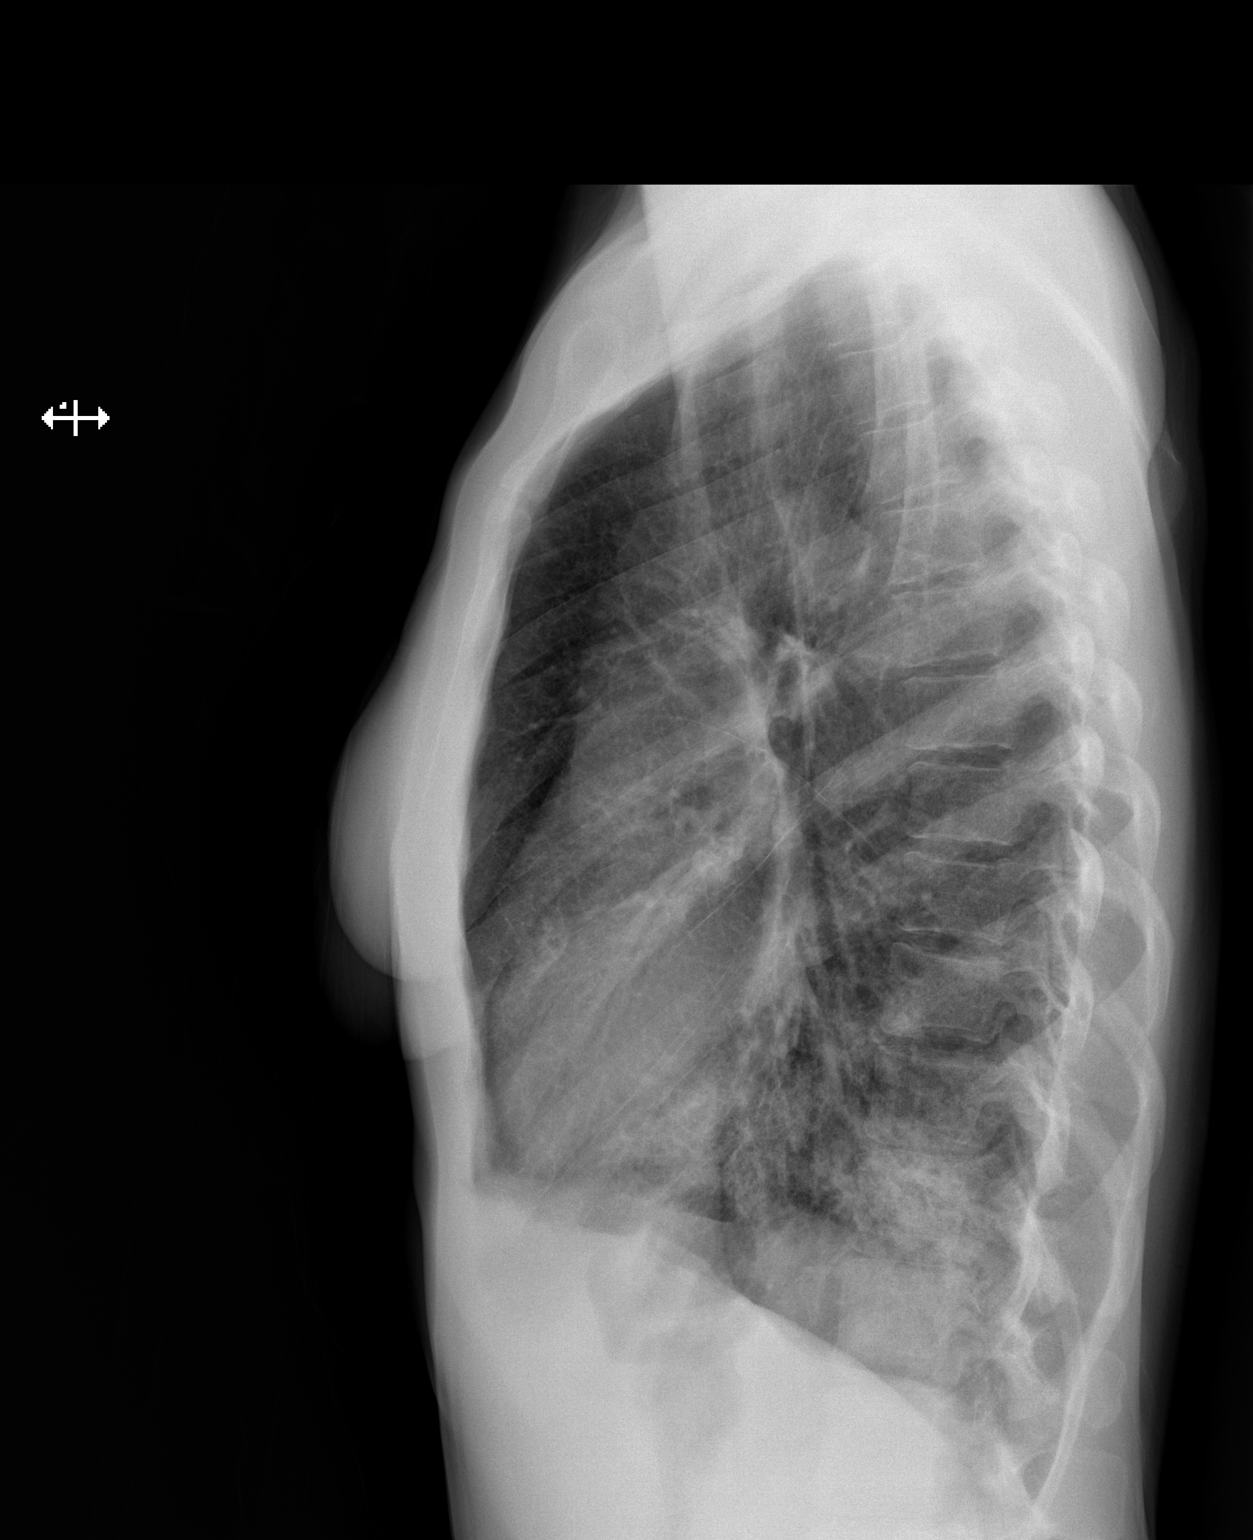

[2 of 2 positions shown; findings below may reference images not displayed]

FINDINGS: The lungs are mildly hyperinflated. There are patchy airspace
opacities in the left lower lobe and lingula. There is no pleural
effusion or pneumothorax. The heart and pulmonary vascularity are
normal.
IMPRESSION: Hyperinflation consistent with COPD. Superimposed lingular and left
lower lobe pneumonia. Followup PA and lateral chest X-ray is
recommended in 3-4 weeks following trial of antibiotic therapy to
ensure resolution and exclude underlying malignancy.

## 2019-10-26 ENCOUNTER — Encounter (HOSPITAL_COMMUNITY): Payer: Self-pay | Admitting: *Deleted

## 2019-10-26 ENCOUNTER — Other Ambulatory Visit: Payer: Self-pay

## 2019-10-26 ENCOUNTER — Emergency Department (HOSPITAL_COMMUNITY)
Admission: EM | Admit: 2019-10-26 | Discharge: 2019-10-27 | Disposition: A | Discharge status: Home / Self Care | Payer: Medicaid Other | Attending: Emergency Medicine | Admitting: Emergency Medicine

## 2019-10-26 DIAGNOSIS — F17210 Nicotine dependence, cigarettes, uncomplicated: Secondary | ICD-10-CM | POA: Diagnosis not present

## 2019-10-26 DIAGNOSIS — N10 Acute tubulo-interstitial nephritis: Secondary | ICD-10-CM | POA: Insufficient documentation

## 2019-10-26 DIAGNOSIS — R1032 Left lower quadrant pain: Secondary | ICD-10-CM | POA: Diagnosis present

## 2019-10-26 DIAGNOSIS — N12 Tubulo-interstitial nephritis, not specified as acute or chronic: Secondary | ICD-10-CM

## 2019-10-26 LAB — URINALYSIS, ROUTINE W REFLEX MICROSCOPIC
Bilirubin Urine: NEGATIVE
Glucose, UA: NEGATIVE mg/dL
Ketones, ur: NEGATIVE mg/dL
Nitrite: POSITIVE — AB
Protein, ur: 100 mg/dL — AB
Specific Gravity, Urine: 1.012 (ref 1.005–1.030)
WBC, UA: >50 WBC/hpf — ABNORMAL HIGH (ref 0–5)
pH: 6 (ref 5.0–8.0)

## 2019-10-26 LAB — CBC
HCT: 33.8 % — ABNORMAL LOW (ref 36.0–46.0)
Hemoglobin: 11.5 g/dL — ABNORMAL LOW (ref 12.0–15.0)
MCH: 34.1 pg — ABNORMAL HIGH (ref 26.0–34.0)
MCHC: 34 g/dL (ref 30.0–36.0)
MCV: 100.3 fL — ABNORMAL HIGH (ref 80.0–100.0)
Platelets: 157 10*3/uL (ref 150–400)
RBC: 3.37 MIL/uL — ABNORMAL LOW (ref 3.87–5.11)
RDW: 12.1 % (ref 11.5–15.5)
WBC: 18 10*3/uL — ABNORMAL HIGH (ref 4.0–10.5)
nRBC: 0 % (ref 0.0–0.2)

## 2019-10-26 LAB — PREGNANCY, URINE: Preg Test, Ur: NEGATIVE

## 2019-10-26 LAB — LACTIC ACID, PLASMA: Lactic Acid, Venous: 1.5 mmol/L (ref 0.5–1.9)

## 2019-10-26 MED ORDER — SODIUM CHLORIDE 0.9 % IV BOLUS
1000.0000 mL | Freq: Once | INTRAVENOUS | Status: AC
  Administered 2019-10-26: 1000 mL via INTRAVENOUS

## 2019-10-26 MED ORDER — ACETAMINOPHEN 500 MG PO TABS
1000.0000 mg | ORAL_TABLET | Freq: Once | ORAL | Status: AC
  Administered 2019-10-26: 1000 mg via ORAL
  Filled 2019-10-26: qty 2

## 2019-10-26 MED ORDER — ONDANSETRON HCL 4 MG/2ML IJ SOLN
4.0000 mg | Freq: Once | INTRAMUSCULAR | Status: AC
  Administered 2019-10-26: 4 mg via INTRAVENOUS
  Filled 2019-10-26: qty 2

## 2019-10-26 MED ORDER — SODIUM CHLORIDE 0.9 % IV SOLN
1.0000 g | Freq: Once | INTRAVENOUS | Status: AC
  Administered 2019-10-26: 1 g via INTRAVENOUS
  Filled 2019-10-26: qty 10

## 2019-10-26 MED ORDER — HYDROMORPHONE HCL 1 MG/ML IJ SOLN
0.5000 mg | Freq: Once | INTRAMUSCULAR | Status: AC
  Administered 2019-10-26: 0.5 mg via INTRAVENOUS
  Filled 2019-10-26: qty 1

## 2019-10-26 MED ORDER — AMOXICILLIN-POT CLAVULANATE 875-125 MG PO TABS: 1.0000 | ORAL_TABLET | Freq: Two times a day (BID) | ORAL | 0 refills | Status: DC

## 2019-10-26 NOTE — ED Triage Notes (Signed)
Pt complains of back pain for the past week. Pt states she thinks she has kidney infection. She reports urine is dark and cloudy.

## 2019-10-26 NOTE — ED Provider Notes (Signed)
[facility] DEPT Provider Note   CSN: [id number] Arrival date & time: 10/26/19  1624     History Chief Complaint  Patient presents with  . Back Pain    Isabella Powell is a 49 y.o. female.  Patient c/o left flank pain for the past few days. Symptoms acute onset, moderate, dull, persistent, constant, non radiating. States also noted urine very cloudy. Mild urgency. No dysuria. Denies recent hx uti, denies hx pyelo. No recent abx use. Denies nausea/vomiting. Having normal bms. Denies back injury or strain. Pt unaware of fever or chills, although temp on arrival 99.9.   The history is provided by the patient.  Back Pain Associated symptoms: no chest pain and no headaches        Past Medical History:  Diagnosis Date  . Bartholin cyst   . Psoriasis-like skin disease   . Renal disorder   . Trichomonas    patient states that many years ago    Patient Active Problem List   Diagnosis Date Noted  . Tobacco abuse 10/08/2016  . Sepsis ([facility]) 10/07/2016  . Acute lower UTI 10/07/2016  . Community acquired pneumonia of right lower lobe of lung 10/07/2016    Past Surgical History:  Procedure Laterality Date  . CESAREAN SECTION    . TUBAL LIGATION     bilateral     OB History    Gravida  7   Para  5   Term  4   Preterm  1   AB  2   Living  5     SAB  1   TAB  1   Ectopic      Multiple      Live Births              Family History  Problem Relation Age of Onset  . Cancer Father   . Emphysema Father     Social History   Tobacco Use  . Smoking status: Current Every Day Smoker    Packs/day: 0.50    Types: Cigarettes  . Smokeless tobacco: Never Used  Substance Use Topics  . Alcohol use: Yes    Comment: occassionally  . Drug use: Yes    Types: Marijuana    Comment: 2-3 times a week.    Home Medications Prior to Admission medications   Medication Sig Start Date End Date Taking? Authorizing Provider  cefdinir  (OMNICEF) 300 MG capsule Take 1 capsule (300 mg total) by mouth 2 (two) times daily. Patient not taking: Reported on 11/07/2017 10/11/16   Annita Brod, MD  levofloxacin (LEVAQUIN) 500 MG tablet Take 1 tablet (500 mg total) by mouth daily. Patient not taking: Reported on 11/07/2017 10/11/16   Annita Brod, MD  Naproxen Sodium (ALEVE) 220 MG CAPS Take 440 mg by mouth daily as needed (pain).    [provider]  Pseudoephedrine-APAP-DM (TYLENOL COLD/FLU SEVERE DAY PO) Take 2 tablets by mouth daily as needed (flu symptoms).    [provider]    Allergies    Patient has no known allergies.  Review of Systems   Review of Systems  Constitutional: Negative for chills and diaphoresis.  HENT: Negative for sore throat.   Eyes: Negative for redness.  Respiratory: Negative for cough and shortness of breath.   Cardiovascular: Negative for chest pain.  Gastrointestinal: Negative for vomiting.  Endocrine: Negative for polyuria.  Genitourinary: Positive for flank pain.  Musculoskeletal: Positive for back pain. Negative for neck pain.  Skin: Negative for rash.  Neurological: Negative for headaches.  Hematological: Does not bruise/bleed easily.  Psychiatric/Behavioral: Negative for confusion.    Physical Exam Updated Vital Signs BP 111/69   Pulse 94   Temp (!) 100.4 F (38 C)   Resp 18   SpO2 95%   Physical Exam Vitals and nursing note reviewed.  Constitutional:      Appearance: Normal appearance. She is well-developed.  HENT:     Head: Atraumatic.     Nose: Nose normal.     Mouth/Throat:     Mouth: Mucous membranes are moist.  Eyes:     General: No scleral icterus.    Conjunctiva/sclera: Conjunctivae normal.  Neck:     Trachea: No tracheal deviation.  Cardiovascular:     Rate and Rhythm: Normal rate and regular rhythm.     Pulses: Normal pulses.     Heart sounds: Normal heart sounds. No murmur. No friction rub. No gallop.   Pulmonary:     Effort:  Pulmonary effort is normal. No respiratory distress.     Breath sounds: Normal breath sounds.  Abdominal:     General: Bowel sounds are normal. There is no distension.     Palpations: Abdomen is soft. There is no mass.     Tenderness: There is abdominal tenderness. There is no guarding or rebound.     Hernia: No hernia is present.     Comments: Left abd tenderness. No peritoneal signs.   Genitourinary:    Comments: +left  cva tenderness.  Musculoskeletal:        General: No swelling or tenderness.     Cervical back: Normal range of motion and neck supple. No rigidity. No muscular tenderness.  Skin:    General: Skin is warm and dry.     Findings: No rash.  Neurological:     Mental Status: She is alert.     Comments: Alert, speech normal.   Psychiatric:        Mood and Affect: Mood normal.     ED Results / Procedures / Treatments   Labs (all labs ordered are listed, but only abnormal results are displayed) Results for orders placed or performed during the hospital encounter of 10/26/19  Urinalysis, Routine w reflex microscopic  Result Value Ref Range   Color, Urine AMBER (A) YELLOW   APPearance CLOUDY (A) CLEAR   Specific Gravity, Urine 1.012 1.005 - 1.030   pH 6.0 5.0 - 8.0   Glucose, UA NEGATIVE NEGATIVE mg/dL   Hgb urine dipstick MODERATE (A) NEGATIVE   Bilirubin Urine NEGATIVE NEGATIVE   Ketones, ur NEGATIVE NEGATIVE mg/dL   Protein, ur 557 (A) NEGATIVE mg/dL   Nitrite POSITIVE (A) NEGATIVE   Leukocytes,Ua LARGE (A) NEGATIVE   RBC / HPF 6-10 0 - 5 RBC/hpf   WBC, UA >50 (H) 0 - 5 WBC/hpf   Bacteria, UA MANY (A) NONE SEEN   Squamous Epithelial / LPF 0-5 0 - 5   WBC Clumps PRESENT    Non Squamous Epithelial 0-5 (A) NONE SEEN  Pregnancy, urine  Result Value Ref Range   Preg Test, Ur NEGATIVE NEGATIVE  Lactic acid, plasma  Result Value Ref Range   Lactic Acid, Venous 1.5 0.5 - 1.9 mmol/L  CBC  Result Value Ref Range   WBC 18.0 (H) 4.0 - 10.5 K/uL   RBC 3.37 (L)  3.87 - 5.11 MIL/uL   Hemoglobin 11.5 (L) 12.0 - 15.0 g/dL   HCT 32.2 (L) 02.5 - 42.7 %  MCV 100.3 (H) 80.0 - 100.0 fL   MCH 34.1 (H) 26.0 - 34.0 pg   MCHC 34.0 30.0 - 36.0 g/dL   RDW 28.3 15.1 - 76.1 %   Platelets 157 150 - 400 K/uL   nRBC 0.0 0.0 - 0.2 %    EKG None  Radiology No results found.  Procedures Procedures (including critical care time)  Medications Ordered in ED Medications  HYDROmorphone (DILAUDID) injection 0.5 mg (has no administration in time range)  ondansetron (ZOFRAN) injection 4 mg (has no administration in time range)  cefTRIAXone (ROCEPHIN) 1 g in sodium chloride 0.9 % 100 mL IVPB (has no administration in time range)  sodium chloride 0.9 % bolus 1,000 mL (has no administration in time range)    ED Course  I have reviewed the triage vital signs and the nursing notes.  Pertinent labs & imaging results that were available during my care of the patient were reviewed by me and considered in my medical decision making (see chart for details).    MDM Rules/Calculators/A&P                      Iv ns. Stat labs sent. Initial bp soft, ns bolus.   Reviewed nursing notes and prior charts for additional history.   Initial labs reviewed/interpreted by me - UA c/w uti w/ > 50 wbc and many bacteria  - culture sent. Rocephin iv.   Additional ivf. Dilaudid iv. zofran iv.   Additional labs reviewed/interpreted by me - lactate normal.  Patient is now eating/drinking. Feels much improved. No current pain or nv.  Pt indicates she feels ready to go home.   RX for home.   Return precautions discussed and provided.      Final Clinical Impression(s) / ED Diagnoses Final diagnoses:  None    Rx / DC Orders ED Discharge Orders    None       Cathren Laine, MD 10/27/19 0013

## 2019-10-26 NOTE — ED Notes (Signed)
Pt was ambulatory to the room.

## 2019-10-26 NOTE — Discharge Instructions (Addendum)
It was our pleasure to provide your ER care today - we hope that you feel better.  Drink plenty of fluids.   Take antibiotic as prescribed for urine infection.   Follow up with primary care doctor in the next couple days - have them check on urine culture results which should be back in the next 2 days time.   Take acetaminophen or ibuprofen as need for pain.   Return to ER if worse, new symptoms, worsening or severe pain, persistent vomiting, weak/faint, or other concern.   You were given medication in the ER - no driving for the next 6 hours.

## 2019-10-29 LAB — URINE CULTURE: Culture: >=100000 — AB

## 2019-10-30 ENCOUNTER — Telehealth: Payer: Self-pay | Admitting: Emergency Medicine

## 2019-10-30 NOTE — Telephone Encounter (Signed)
Post ED Visit - Positive Culture Follow-up  Culture report reviewed by antimicrobial stewardship pharmacist: [facility] []  , Pharm.D. []  Enzo Bi, Pharm.D., BCPS AQ-ID []  , Pharm.D., BCPS []  Celedonio Miyamoto, .D., BCPS []  [place], .D., BCPS, AAHIVP []  Georgina Pillion, Pharm.D., BCPS, AAHIVP []  [address], PharmD, BCPS []  , PharmD, BCPS []  [place], PharmD, BCPS []  [address], PharmD []  , PharmD, BCPS []  Estella Husk, PharmD  Pharmacy Team []  Lysle Pearl, PharmD []  , PharmD []  Phillips Climes, PharmD []  , Rph []  Agapito Games) , PharmD []  Verlan Friends, PharmD []  , PharmD []  Mervyn Gay, PharmD []  , PharmD []  Vinnie Level, PharmD []  [facility], PharmD []  , PharmD [x]  Len Childs, PharmD   Positive urine culture Treated with amoxicillin-pot clavulante, organism sensitive to the same and no further patient follow-up is required at this time.  10/30/2019, 12:04 PM

## 2020-01-03 ENCOUNTER — Encounter (HOSPITAL_COMMUNITY): Payer: Self-pay

## 2020-01-03 ENCOUNTER — Other Ambulatory Visit: Payer: Self-pay

## 2020-01-03 ENCOUNTER — Emergency Department (HOSPITAL_COMMUNITY)
Admission: EM | Admit: 2020-01-03 | Discharge: 2020-01-03 | Disposition: A | Discharge status: Home / Self Care | Payer: Medicaid Other | Attending: Emergency Medicine | Admitting: Emergency Medicine

## 2020-01-03 DIAGNOSIS — A599 Trichomoniasis, unspecified: Secondary | ICD-10-CM | POA: Diagnosis not present

## 2020-01-03 DIAGNOSIS — R300 Dysuria: Secondary | ICD-10-CM | POA: Diagnosis present

## 2020-01-03 DIAGNOSIS — N898 Other specified noninflammatory disorders of vagina: Secondary | ICD-10-CM | POA: Diagnosis not present

## 2020-01-03 DIAGNOSIS — F17210 Nicotine dependence, cigarettes, uncomplicated: Secondary | ICD-10-CM | POA: Insufficient documentation

## 2020-01-03 DIAGNOSIS — Z79899 Other long term (current) drug therapy: Secondary | ICD-10-CM | POA: Insufficient documentation

## 2020-01-03 LAB — URINALYSIS, ROUTINE W REFLEX MICROSCOPIC
Bilirubin Urine: NEGATIVE
Glucose, UA: NEGATIVE mg/dL
Ketones, ur: NEGATIVE mg/dL
Nitrite: NEGATIVE
Protein, ur: 100 mg/dL — AB
RBC / HPF: >50 RBC/hpf — ABNORMAL HIGH (ref 0–5)
Specific Gravity, Urine: 1.02 (ref 1.005–1.030)
WBC, UA: >50 WBC/hpf — ABNORMAL HIGH (ref 0–5)
pH: 6 (ref 5.0–8.0)

## 2020-01-03 LAB — WET PREP, GENITAL
Clue Cells Wet Prep HPF POC: NONE SEEN
Sperm: NONE SEEN
Yeast Wet Prep HPF POC: NONE SEEN

## 2020-01-03 LAB — CBC
HCT: 35.5 % — ABNORMAL LOW (ref 36.0–46.0)
Hemoglobin: 11.3 g/dL — ABNORMAL LOW (ref 12.0–15.0)
MCH: 34 pg (ref 26.0–34.0)
MCHC: 31.8 g/dL (ref 30.0–36.0)
MCV: 106.9 fL — ABNORMAL HIGH (ref 80.0–100.0)
Platelets: 217 10*3/uL (ref 150–400)
RBC: 3.32 MIL/uL — ABNORMAL LOW (ref 3.87–5.11)
RDW: 12.3 % (ref 11.5–15.5)
WBC: 4.4 10*3/uL (ref 4.0–10.5)
nRBC: 0 % (ref 0.0–0.2)

## 2020-01-03 LAB — BASIC METABOLIC PANEL
Anion gap: 11 (ref 5–15)
BUN: <5 mg/dL — ABNORMAL LOW (ref 6–20)
CO2: 25 mmol/L (ref 22–32)
Calcium: 8.9 mg/dL (ref 8.9–10.3)
Chloride: 102 mmol/L (ref 98–111)
Creatinine, Ser: 0.67 mg/dL (ref 0.44–1.00)
GFR calc Af Amer: >60 mL/min (ref >60)
GFR calc non Af Amer: >60 mL/min (ref >60)
Glucose, Bld: 107 mg/dL — ABNORMAL HIGH (ref 70–99)
Potassium: 3.5 mmol/L (ref 3.5–5.1)
Sodium: 138 mmol/L (ref 135–145)

## 2020-01-03 LAB — HIV ANTIBODY (ROUTINE TESTING W REFLEX): HIV Screen 4th Generation wRfx: NONREACTIVE

## 2020-01-03 LAB — I-STAT BETA HCG BLOOD, ED (MC, WL, AP ONLY): I-stat hCG, quantitative: <5 m[IU]/mL (ref <5)

## 2020-01-03 MED ORDER — METRONIDAZOLE 500 MG PO TABS
2000.0000 mg | ORAL_TABLET | Freq: Once | ORAL | Status: AC
  Administered 2020-01-03: 2000 mg via ORAL
  Filled 2020-01-03: qty 4

## 2020-01-03 MED ORDER — CEFTRIAXONE SODIUM 500 MG IJ SOLR
500.0000 mg | Freq: Once | INTRAMUSCULAR | Status: AC
  Administered 2020-01-03: 500 mg via INTRAMUSCULAR
  Filled 2020-01-03: qty 500

## 2020-01-03 MED ORDER — DOXYCYCLINE HYCLATE 100 MG PO CAPS: 100.0000 mg | ORAL_CAPSULE | Freq: Two times a day (BID) | ORAL | 0 refills | Status: AC

## 2020-01-03 NOTE — ED Triage Notes (Signed)
Pt arrives POV for eval of dysuria, frequency, urgency x 3 days. Pt reports she has hx of UTIs and this feels similar. Endorses colorless, non foul smelling discharge for same amt of time
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
## 2020-01-03 NOTE — Discharge Instructions (Addendum)
You have been tested for HIV, syphilis, chlamydia and gonorrhea.  These results will be available in approximately 3 days and you will be contacted by the hospital if the results are positive. Avoid sexual contact until you are aware of the results, and please inform all sexual partners if you test positive for any of these diseases.  You were given a prescription for antibiotics. Please take the antibiotic prescription fully.   Please follow up with your primary care provider within 5-7 days for re-evaluation of your symptoms. If you do not have a primary care provider, information for a healthcare clinic has been provided for you to make arrangements for follow up care. Please return to the emergency department for any new or worsening symptoms.

## 2020-01-03 NOTE — ED Notes (Signed)
Verbalized understanding of DC instructions and Rx.  

## 2020-01-04 LAB — URINE CULTURE: Culture: NO GROWTH

## 2020-01-04 LAB — RPR: RPR Ser Ql: NONREACTIVE

## 2020-01-06 LAB — GC/CHLAMYDIA PROBE AMP (~~LOC~~) NOT AT ARMC
Chlamydia: NEGATIVE
Comment: NEGATIVE
Comment: NORMAL
Neisseria Gonorrhea: NEGATIVE

## 2020-08-05 ENCOUNTER — Encounter (HOSPITAL_COMMUNITY): Payer: Self-pay

## 2020-08-05 ENCOUNTER — Emergency Department (HOSPITAL_COMMUNITY)
Admission: EM | Admit: 2020-08-05 | Discharge: 2020-08-05 | Payer: Medicaid Other | Attending: Emergency Medicine | Admitting: Emergency Medicine

## 2020-08-05 DIAGNOSIS — F17210 Nicotine dependence, cigarettes, uncomplicated: Secondary | ICD-10-CM | POA: Diagnosis not present

## 2020-08-05 DIAGNOSIS — S00501A Unspecified superficial injury of lip, initial encounter: Secondary | ICD-10-CM | POA: Insufficient documentation

## 2020-08-05 DIAGNOSIS — S0093XA Contusion of unspecified part of head, initial encounter: Secondary | ICD-10-CM | POA: Insufficient documentation

## 2020-08-05 DIAGNOSIS — Y92149 Unspecified place in prison as the place of occurrence of the external cause: Secondary | ICD-10-CM | POA: Diagnosis not present

## 2020-08-05 DIAGNOSIS — F1590 Other stimulant use, unspecified, uncomplicated: Secondary | ICD-10-CM | POA: Diagnosis not present

## 2020-08-05 DIAGNOSIS — S0993XA Unspecified injury of face, initial encounter: Secondary | ICD-10-CM

## 2020-08-05 MED ORDER — ACETAMINOPHEN 500 MG PO TABS
1000.0000 mg | ORAL_TABLET | Freq: Once | ORAL | Status: AC
  Administered 2020-08-05: 1000 mg via ORAL
  Filled 2020-08-05: qty 2
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
## 2020-12-31 ENCOUNTER — Encounter (HOSPITAL_COMMUNITY): Payer: Self-pay

## 2020-12-31 ENCOUNTER — Ambulatory Visit (HOSPITAL_COMMUNITY)
Admission: RE | Admit: 2020-12-31 | Discharge: 2020-12-31 | Disposition: A | Discharge status: Home / Self Care | Payer: Medicaid Other | Source: Ambulatory Visit | Attending: Urgent Care | Admitting: Urgent Care

## 2020-12-31 ENCOUNTER — Other Ambulatory Visit: Payer: Self-pay

## 2020-12-31 VITALS — BP 119/88 | HR 73 | Temp 98.6°F | Resp 17

## 2020-12-31 DIAGNOSIS — H109 Unspecified conjunctivitis: Secondary | ICD-10-CM

## 2020-12-31 DIAGNOSIS — J00 Acute nasopharyngitis [common cold]: Secondary | ICD-10-CM

## 2020-12-31 DIAGNOSIS — H6123 Impacted cerumen, bilateral: Secondary | ICD-10-CM

## 2020-12-31 MED ORDER — CETIRIZINE HCL 10 MG PO TABS
10.0000 mg | ORAL_TABLET | Freq: Every day | ORAL | 0 refills | Status: DC
Start: 2020-12-31 — End: 2021-10-24

## 2020-12-31 MED ORDER — ERYTHROMYCIN 5 MG/GM OP OINT
TOPICAL_OINTMENT | OPHTHALMIC | 0 refills | Status: DC
Start: 2020-12-31 — End: 2021-10-04

## 2020-12-31 MED ORDER — PSEUDOEPHEDRINE HCL 30 MG PO TABS
60.0000 mg | ORAL_TABLET | Freq: Three times a day (TID) | ORAL | 0 refills | Status: DC | PRN
Start: 2020-12-31 — End: 2021-10-24

## 2020-12-31 NOTE — ED Triage Notes (Addendum)
Pt in with c/o eye soreness, drainage and itching that started on Tuesday  Pt used visine with no relief Pt also c/o her ear being clogged and having a head cold   Pt states she was diagnosed with hep c and would like to speak to a provider about it

## 2020-12-31 NOTE — ED Notes (Signed)
Patient walked out of building

## 2020-12-31 NOTE — ED Notes (Signed)
Called in the lobby, pt not answered.
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
## 2020-12-31 NOTE — ED Provider Notes (Signed)
[facility]   MRN: [id number] DOB: [birth year]  Subjective:   Isabella Powell is a 50 y.o. female presenting for 2-day history of acute onset right eye soreness, redness, itching and drainage.  Denies vision change, photophobia, eyelid swelling or pain, eye trauma, contact lenses.  Has been using Visine without any relief.  She also has sinus congestion, bilateral ear fullness.  Feels like her ear is clogged.  Has had the symptoms over the past week.  Has not used medications for relief.  Denies chest pain, shortness of breath, cough.  No current facility-administered medications for this encounter.  Current Outpatient Medications:    cefdinir (OMNICEF) 300 MG capsule, Take 1 capsule (300 mg total) by mouth 2 (two) times daily., Disp: 6 capsule, Rfl: 0   levofloxacin (LEVAQUIN) 500 MG tablet, Take 1 tablet (500 mg total) by mouth daily., Disp: 3 tablet, Rfl: 0   Naproxen Sodium (ALEVE) 220 MG CAPS, Take 440 mg by mouth daily as needed (pain)., Disp: , Rfl:    Pseudoephedrine-APAP-DM (TYLENOL COLD/FLU SEVERE DAY PO), Take 2 tablets by mouth daily as needed (flu symptoms)., Disp: , Rfl:    No Known Allergies  Past Medical History:  Diagnosis Date   Bartholin cyst    Psoriasis-like skin disease    Renal disorder    Trichomonas    patient states that many years ago     Past Surgical History:  Procedure Laterality Date   CESAREAN SECTION     TUBAL LIGATION     bilateral    Family History  Problem Relation Age of Onset   Cancer Father    Emphysema Father     Social History   Tobacco Use   Smoking status: Every Day    Packs/day: 0.50    Pack years: 0.00    Types: Cigarettes   Smokeless tobacco: Never  Vaping Use   Vaping Use: Never used  Substance Use Topics   Alcohol use: Yes    Comment: occassionally   Drug use: Yes    Types: Marijuana    Comment: 2-3 times a week.    ROS   Objective:   Vitals: BP 119/88 (BP Location: Right Arm)   Pulse  73   Temp 98.6 F (37 C) (Oral)   Resp 17   SpO2 98%   Physical Exam Constitutional:      General: She is not in acute distress.    Appearance: She is well-developed. She is not ill-appearing.  HENT:     Head: Normocephalic and atraumatic.     Right Ear: Tympanic membrane, ear canal and external ear normal. No drainage or tenderness. No middle ear effusion. There is impacted cerumen. Tympanic membrane is not erythematous.     Left Ear: Tympanic membrane, ear canal and external ear normal. No drainage or tenderness.  No middle ear effusion. There is impacted cerumen. Tympanic membrane is not erythematous.     Nose: No congestion or rhinorrhea.     Mouth/Throat:     Mouth: Mucous membranes are moist. No oral lesions.     Pharynx: Oropharynx is clear. No pharyngeal swelling, oropharyngeal exudate, posterior oropharyngeal erythema or uvula swelling.     Tonsils: No tonsillar exudate or tonsillar abscesses.  Eyes:     Extraocular Movements:     Right eye: Normal extraocular motion.     Left eye: Normal extraocular motion.     Conjunctiva/sclera: Conjunctivae normal.     Pupils: Pupils are equal, round,  and reactive to light.  Cardiovascular:     Rate and Rhythm: Normal rate.  Pulmonary:     Effort: Pulmonary effort is normal.  Musculoskeletal:     Cervical back: Normal range of motion and neck supple.  Lymphadenopathy:     Cervical: No cervical adenopathy.  Skin:    General: Skin is warm and dry.  Neurological:     General: No focal deficit present.     Mental Status: She is alert and oriented to person, place, and time.  Psychiatric:        Mood and Affect: Mood normal.        Behavior: Behavior normal.   Ear lavage performed using mixture of peroxide and water.  Pressure irrigation performed using a bottle and a thin ear tube.  Bilateral ear lavage.  Curette was also used.    Assessment and Plan :   PDMP not reviewed this encounter.  1. Acute rhinitis   2. Bacterial  conjunctivitis of right eye   3. Bilateral impacted cerumen     Successful bilateral ear lavage.  General management of cerumen impaction reviewed with patient.  Anticipatory guidance provided.    Suspect viral URI, viral syndrome; physical exam findings reassuring and vital signs stable for discharge. Advised supportive care, offered symptomatic relief. Patient refused COVID 19 test. Counseled patient on potential for adverse effects with medications prescribed/recommended today, ER and return-to-clinic precautions discussed, patient verbalized understanding.     Wallis Bamberg, PA-C 12/31/20 1723

## 2021-02-15 ENCOUNTER — Emergency Department (HOSPITAL_COMMUNITY)
Admission: EM | Admit: 2021-02-15 | Discharge: 2021-02-15 | Disposition: A | Discharge status: Home / Self Care | Payer: Medicaid Other | Attending: Emergency Medicine | Admitting: Emergency Medicine

## 2021-02-15 ENCOUNTER — Emergency Department (HOSPITAL_COMMUNITY): Payer: Medicaid Other

## 2021-02-15 ENCOUNTER — Encounter (HOSPITAL_COMMUNITY): Payer: Self-pay | Admitting: Emergency Medicine

## 2021-02-15 DIAGNOSIS — E876 Hypokalemia: Secondary | ICD-10-CM

## 2021-02-15 DIAGNOSIS — N9489 Other specified conditions associated with female genital organs and menstrual cycle: Secondary | ICD-10-CM | POA: Diagnosis not present

## 2021-02-15 DIAGNOSIS — R509 Fever, unspecified: Secondary | ICD-10-CM | POA: Diagnosis not present

## 2021-02-15 DIAGNOSIS — N3000 Acute cystitis without hematuria: Secondary | ICD-10-CM

## 2021-02-15 DIAGNOSIS — R1084 Generalized abdominal pain: Secondary | ICD-10-CM | POA: Insufficient documentation

## 2021-02-15 DIAGNOSIS — R110 Nausea: Secondary | ICD-10-CM | POA: Insufficient documentation

## 2021-02-15 DIAGNOSIS — R0981 Nasal congestion: Secondary | ICD-10-CM | POA: Insufficient documentation

## 2021-02-15 DIAGNOSIS — Z87448 Personal history of other diseases of urinary system: Secondary | ICD-10-CM | POA: Diagnosis not present

## 2021-02-15 DIAGNOSIS — R109 Unspecified abdominal pain: Secondary | ICD-10-CM | POA: Diagnosis present

## 2021-02-15 DIAGNOSIS — F17210 Nicotine dependence, cigarettes, uncomplicated: Secondary | ICD-10-CM | POA: Insufficient documentation

## 2021-02-15 LAB — URINALYSIS, ROUTINE W REFLEX MICROSCOPIC
Bacteria, UA: NONE SEEN
Bilirubin Urine: NEGATIVE
Glucose, UA: NEGATIVE mg/dL
Hgb urine dipstick: NEGATIVE
Ketones, ur: NEGATIVE mg/dL
Nitrite: NEGATIVE
Protein, ur: NEGATIVE mg/dL
Specific Gravity, Urine: 1.02 (ref 1.005–1.030)
WBC, UA: >50 WBC/hpf — ABNORMAL HIGH (ref 0–5)
pH: 5 (ref 5.0–8.0)

## 2021-02-15 LAB — HEPATIC FUNCTION PANEL
ALT: 46 U/L — ABNORMAL HIGH (ref 0–44)
AST: 63 U/L — ABNORMAL HIGH (ref 15–41)
Albumin: 2.9 g/dL — ABNORMAL LOW (ref 3.5–5.0)
Alkaline Phosphatase: 115 U/L (ref 38–126)
Bilirubin, Direct: 0.5 mg/dL — ABNORMAL HIGH (ref 0.0–0.2)
Indirect Bilirubin: 0.7 mg/dL (ref 0.3–0.9)
Total Bilirubin: 1.2 mg/dL (ref 0.3–1.2)
Total Protein: 8.8 g/dL — ABNORMAL HIGH (ref 6.5–8.1)

## 2021-02-15 LAB — BASIC METABOLIC PANEL
Anion gap: 7 (ref 5–15)
BUN: 11 mg/dL (ref 6–20)
CO2: 20 mmol/L — ABNORMAL LOW (ref 22–32)
Calcium: 8.3 mg/dL — ABNORMAL LOW (ref 8.9–10.3)
Chloride: 106 mmol/L (ref 98–111)
Creatinine, Ser: 0.73 mg/dL (ref 0.44–1.00)
GFR, Estimated: >60 mL/min (ref >60)
Glucose, Bld: 126 mg/dL — ABNORMAL HIGH (ref 70–99)
Potassium: 2.9 mmol/L — ABNORMAL LOW (ref 3.5–5.1)
Sodium: 133 mmol/L — ABNORMAL LOW (ref 135–145)

## 2021-02-15 LAB — CBC
HCT: 35.4 % — ABNORMAL LOW (ref 36.0–46.0)
Hemoglobin: 11.6 g/dL — ABNORMAL LOW (ref 12.0–15.0)
MCH: 33.8 pg (ref 26.0–34.0)
MCHC: 32.8 g/dL (ref 30.0–36.0)
MCV: 103.2 fL — ABNORMAL HIGH (ref 80.0–100.0)
Platelets: 139 10*3/uL — ABNORMAL LOW (ref 150–400)
RBC: 3.43 MIL/uL — ABNORMAL LOW (ref 3.87–5.11)
RDW: 13 % (ref 11.5–15.5)
WBC: 14.1 10*3/uL — ABNORMAL HIGH (ref 4.0–10.5)
nRBC: 0 % (ref 0.0–0.2)

## 2021-02-15 LAB — LIPASE, BLOOD: Lipase: 27 U/L (ref 11–51)

## 2021-02-15 LAB — HCG, QUANTITATIVE, PREGNANCY: hCG, Beta Chain, Quant, S: 2 m[IU]/mL (ref <5)

## 2021-02-15 MED ORDER — CETIRIZINE HCL 5 MG/5ML PO SOLN: 5.0000 mg | Freq: Once | ORAL | Status: DC

## 2021-02-15 MED ORDER — CEPHALEXIN 500 MG PO CAPS: 500.0000 mg | ORAL_CAPSULE | Freq: Two times a day (BID) | ORAL | 0 refills | Status: AC

## 2021-02-15 MED ORDER — SODIUM CHLORIDE 0.9 % IV BOLUS
1000.0000 mL | Freq: Once | INTRAVENOUS | Status: AC
  Administered 2021-02-15: 1000 mL via INTRAVENOUS

## 2021-02-15 MED ORDER — POTASSIUM CHLORIDE ER 10 MEQ PO TBCR: 10.0000 meq | EXTENDED_RELEASE_TABLET | Freq: Every day | ORAL | 0 refills | Status: AC

## 2021-02-15 MED ORDER — LORATADINE 10 MG PO TABS
10.0000 mg | ORAL_TABLET | Freq: Every day | ORAL | Status: DC
  Administered 2021-02-15: 10 mg via ORAL
  Filled 2021-02-15: qty 1

## 2021-02-15 MED ORDER — CEPHALEXIN 500 MG PO CAPS: 500.0000 mg | ORAL_CAPSULE | Freq: Two times a day (BID) | ORAL | 0 refills | Status: DC

## 2021-02-15 MED ORDER — POTASSIUM CHLORIDE 10 MEQ/100ML IV SOLN
10.0000 meq | Freq: Once | INTRAVENOUS | Status: AC
  Administered 2021-02-15: 10 meq via INTRAVENOUS
  Filled 2021-02-15: qty 100

## 2021-02-15 MED ORDER — POTASSIUM CHLORIDE CRYS ER 20 MEQ PO TBCR
40.0000 meq | EXTENDED_RELEASE_TABLET | Freq: Once | ORAL | Status: AC
  Administered 2021-02-15: 40 meq via ORAL
  Filled 2021-02-15: qty 2

## 2021-02-15 MED ORDER — POTASSIUM CHLORIDE ER 10 MEQ PO TBCR: 10.0000 meq | EXTENDED_RELEASE_TABLET | Freq: Every day | ORAL | 0 refills | Status: DC

## 2021-02-15 MED ORDER — FENTANYL CITRATE (PF) 100 MCG/2ML IJ SOLN
50.0000 ug | Freq: Once | INTRAMUSCULAR | Status: AC
  Administered 2021-02-15: 50 ug via INTRAVENOUS
  Filled 2021-02-15: qty 2

## 2021-02-15 MED ORDER — SODIUM CHLORIDE 0.9 % IV SOLN
1.0000 g | Freq: Once | INTRAVENOUS | Status: AC
  Administered 2021-02-15: 1 g via INTRAVENOUS
  Filled 2021-02-15: qty 10

## 2021-02-15 MED ORDER — ONDANSETRON HCL 4 MG/2ML IJ SOLN
4.0000 mg | Freq: Once | INTRAMUSCULAR | Status: AC
  Administered 2021-02-15: 4 mg via INTRAVENOUS
  Filled 2021-02-15: qty 2

## 2021-02-15 NOTE — ED Provider Notes (Signed)
Assumed care of patient at change of shift pending CT abdomen pelvis without contrast.  See prior note for complete H&P. Patient is found to have a urinary tract infection, treated in the emergency room with Rocephin.  Also found to have hypokalemia, given potassium replacement. Physical Exam  BP 100/61   Pulse 88   Temp 99 F (37.2 C) (Oral)   Resp 17   SpO2 100%   Physical Exam  ED Course/Procedures     Procedures  MDM  CT is negative for stone or other acute findings.  Patient will be discharged on Keflex with 5 days of oral potassium supplement.  Advised to have PCP recheck in 1 week.  Given return to ER precautions.       Jeannie Fend, PA-C 02/15/21 1638    Virgina Norfolk, DO 02/15/21 2303

## 2021-02-15 NOTE — ED Provider Notes (Signed)
[facility]-EMERGENCY DEPT Provider Note   CSN: [id number] Arrival date & time: 02/15/21  1040     History Chief Complaint  Patient presents with   Flank Pain    Isabella Powell is a 50 y.o. female.  The history is provided by the patient and medical records. No language interpreter was used.  Flank Pain    50 year old female with history of renal disorder, alcohol abuse presenting for evaluation of flank pain.  For the past 6 days she has been having persistent pain to both flanks right greater than left.  Pain radiates towards the right side abdomen, pain is sharp stabbing 10 out of 10 with cysts associated fever, chills.  No runny nose sneezing or coughing.  Endorse nausea without vomiting or diarrhea.  No report of any urinary discomfort.  She does admits to drinking alcohol more frequently due to the loss of her daughter recently.  No prior history of kidney stone.  No vaginal bleeding or vaginal discharge.  No specific treatment tried at home except Goody's powders.     Past Medical History:  Diagnosis Date   Bartholin cyst    Psoriasis-like skin disease    Renal disorder    Trichomonas    patient states that many years ago    Patient Active Problem List   Diagnosis Date Noted   Tobacco abuse 10/08/2016   Sepsis (HCC) 10/07/2016   Acute lower UTI 10/07/2016   Community acquired pneumonia of right lower lobe of lung 10/07/2016    Past Surgical History:  Procedure Laterality Date   CESAREAN SECTION     TUBAL LIGATION     bilateral     OB History     Gravida  7   Para  5   Term  4   Preterm  1   AB  2   Living  5      SAB  1   IAB  1   Ectopic      Multiple      Live Births              Family History  Problem Relation Age of Onset   Cancer Father    Emphysema Father     Social History   Tobacco Use   Smoking status: Every Day    Packs/day: 0.50    Types: Cigarettes   Smokeless tobacco: Never  Vaping  Use   Vaping Use: Never used  Substance Use Topics   Alcohol use: Yes    Comment: occassionally   Drug use: Yes    Types: Marijuana    Comment: 2-3 times a week.    Home Medications Prior to Admission medications   Medication Sig Start Date End Date Taking? Authorizing Provider  cefdinir (OMNICEF) 300 MG capsule Take 1 capsule (300 mg total) by mouth 2 (two) times daily. 10/11/16   Hollice Espy, MD  cetirizine (ZYRTEC ALLERGY) 10 MG tablet Take 1 tablet (10 mg total) by mouth daily. 12/31/20   Wallis Bamberg, PA-C  erythromycin ophthalmic ointment Place a 1/2 inch ribbon of ointment into the lower right eyelid once every 4 hours while awake. 12/31/20   Wallis Bamberg, PA-C  levofloxacin (LEVAQUIN) 500 MG tablet Take 1 tablet (500 mg total) by mouth daily. 10/11/16   Hollice Espy, MD  Naproxen Sodium (ALEVE) 220 MG CAPS Take 440 mg by mouth daily as needed (pain).    [provider]  pseudoephedrine (SUDAFED)  30 MG tablet Take 2 tablets (60 mg total) by mouth every 8 (eight) hours as needed for congestion. 12/31/20   Wallis Bamberg, PA-C  Pseudoephedrine-APAP-DM (TYLENOL COLD/FLU SEVERE DAY PO) Take 2 tablets by mouth daily as needed (flu symptoms).    [provider]    Allergies    Patient has no known allergies.  Review of Systems   Review of Systems  Genitourinary:  Positive for flank pain.  All other systems reviewed and are negative.  Physical Exam Updated Vital Signs BP 103/66   Pulse 92   Temp 99 F (37.2 C) (Oral)   Resp 16   SpO2 99%   Physical Exam Vitals and nursing note reviewed.  Constitutional:      General: She is not in acute distress.    Appearance: She is well-developed.  HENT:     Head: Atraumatic.  Eyes:     Conjunctiva/sclera: Conjunctivae normal.  Cardiovascular:     Rate and Rhythm: Normal rate and regular rhythm.     Pulses: Normal pulses.     Heart sounds: Normal heart sounds.  Pulmonary:     Effort: Pulmonary effort is  normal.  Abdominal:     Palpations: Abdomen is soft.     Tenderness: There is abdominal tenderness (Diffuse abdominal tenderness without guarding or rebound tenderness). There is right CVA tenderness and left CVA tenderness.  Musculoskeletal:     Cervical back: Neck supple.  Skin:    Findings: No rash.  Neurological:     Mental Status: She is alert.  Psychiatric:        Mood and Affect: Mood normal.    ED Results / Procedures / Treatments   Labs (all labs ordered are listed, but only abnormal results are displayed) Labs Reviewed  URINALYSIS, ROUTINE W REFLEX MICROSCOPIC - Abnormal; Notable for the following components:      Result Value   Color, Urine AMBER (*)    APPearance HAZY (*)    Leukocytes,Ua LARGE (*)    WBC, UA >50 (*)    All other components within normal limits  BASIC METABOLIC PANEL - Abnormal; Notable for the following components:   Sodium 133 (*)    Potassium 2.9 (*)    CO2 20 (*)    Glucose, Bld 126 (*)    Calcium 8.3 (*)    All other components within normal limits  CBC - Abnormal; Notable for the following components:   WBC 14.1 (*)    RBC 3.43 (*)    Hemoglobin 11.6 (*)    HCT 35.4 (*)    MCV 103.2 (*)    Platelets 139 (*)    All other components within normal limits  HEPATIC FUNCTION PANEL - Abnormal; Notable for the following components:   Total Protein 8.8 (*)    Albumin 2.9 (*)    AST 63 (*)    ALT 46 (*)    Bilirubin, Direct 0.5 (*)    All other components within normal limits  HCG, QUANTITATIVE, PREGNANCY  LIPASE, BLOOD    EKG None  Radiology No results found.  Procedures Procedures   Medications Ordered in ED Medications  cefTRIAXone (ROCEPHIN) 1 g in sodium chloride 0.9 % 100 mL IVPB (1 g Intravenous Bolus 02/15/21 1451)  potassium chloride 10 mEq in 100 mL IVPB (10 mEq Intravenous New Bag/Given 02/15/21 1455)  loratadine (CLARITIN) tablet 10 mg (10 mg Oral Given 02/15/21 1450)  fentaNYL (SUBLIMAZE) injection 50 mcg (50 mcg  Intravenous Given 02/15/21 1450)  ondansetron (ZOFRAN) injection 4 mg (4 mg Intravenous Given 02/15/21 1450)  potassium chloride SA (KLOR-CON) CR tablet 40 mEq (40 mEq Oral Given 02/15/21 1450)  sodium chloride 0.9 % bolus 1,000 mL (1,000 mLs Intravenous Bolus 02/15/21 1451)    ED Course  I have reviewed the triage vital signs and the nursing notes.  Pertinent labs & imaging results that were available during my care of the patient were reviewed by me and considered in my medical decision making (see chart for details).    MDM Rules/Calculators/A&P                           BP (!) 109/91   Pulse (!) 103   Temp 99 F (37.2 C) (Oral)   Resp 16   SpO2 100%   Final Clinical Impression(s) / ED Diagnoses Final diagnoses:  None    Rx / DC Orders ED Discharge Orders     None      2:37 PM Patient here complaining of bilateral flank pain right greater than left for nearly a week and now also endorsed having subjective fever and chills without any dysuria.  No other infectious symptoms.  And no report of any hematuria.  She does have tenderness to palpation of abdomen and has CVA tenderness on exam.  Labs remarkable for UA indicating UTI.  She is also hypokalemic with a potassium of 2.9 will provide replenishment.  Elevated white count of 14.1 noted, oral temperature is 99.0.  Will obtain CT aneurysm study to rule out potential infected stone or pyelonephritis.  We will give IV fluid and pain medication.  Patient also complaining of some congestion and request for Zyrtec.    3:09 PM Pt sign out to oncoming provider who will f/u on CT result and reassess pt to determine disposition.    Fayrene Helper, PA-C 02/15/21 1512    Koleen Distance, MD 02/15/21 [phone]

## 2021-02-15 NOTE — Discharge Instructions (Addendum)
Take the antibiotics as prescribed and complete the full course.  Take potassium daily for the next 5 days as prescribed.  Recommend recheck with your doctor next week.  Return to the emergency room for worsening or concerning symptoms.

## 2021-02-15 NOTE — ED Triage Notes (Addendum)
Per EMS-B/L flank pain radiating to her back-symptoms started Thursday-urine dark in color with an odor

## 2021-02-15 NOTE — ED Provider Notes (Signed)
Emergency Medicine Provider Triage Evaluation Note  Isabella Powell , a 50 y.o. female  was evaluated in triage.  Pt complains of bilat flank pain that is worse on the right and started about 1 week ago. Denies dysuria. Last week had nv. Reports intermittent fevers  Pt reports recent heavy etoh use due to recent anniversary of her daughters death date  Review of Systems  Positive: Flank pain Negative: dysuria  Physical Exam  BP 107/71 (BP Location: Left Arm)   Pulse (!) 114   Temp (!) 97.2 F (36.2 C) (Oral)   Resp 16   SpO2 97%  Gen:   Awake, no distress   Resp:  Normal effort  MSK:   Moves extremities without difficulty  Other:  Bilat cva ttp  Medical Decision Making  Medically screening exam initiated at 12:39 PM.  Appropriate orders placed.  JOURNIE HOWSON was informed that the remainder of the evaluation will be completed by another provider, this initial triage assessment does not replace that evaluation, and the importance of remaining in the ED until their evaluation is complete.     Rayne Du 02/15/21 1239    Mancel Bale, MD 02/15/21 [phone]

## 2021-10-03 ENCOUNTER — Emergency Department (HOSPITAL_COMMUNITY)
Admission: EM | Admit: 2021-10-03 | Discharge: 2021-10-04 | Disposition: A | Discharge status: Home / Self Care | Payer: Medicaid Other | Attending: Emergency Medicine | Admitting: Emergency Medicine

## 2021-10-03 ENCOUNTER — Other Ambulatory Visit: Payer: Self-pay

## 2021-10-03 ENCOUNTER — Encounter (HOSPITAL_COMMUNITY): Payer: Self-pay | Admitting: Emergency Medicine

## 2021-10-03 ENCOUNTER — Emergency Department (HOSPITAL_COMMUNITY): Payer: Medicaid Other

## 2021-10-03 DIAGNOSIS — L03116 Cellulitis of left lower limb: Secondary | ICD-10-CM | POA: Diagnosis not present

## 2021-10-03 DIAGNOSIS — Z23 Encounter for immunization: Secondary | ICD-10-CM | POA: Insufficient documentation

## 2021-10-03 DIAGNOSIS — M79672 Pain in left foot: Secondary | ICD-10-CM | POA: Diagnosis present

## 2021-10-03 LAB — CBC WITH DIFFERENTIAL/PLATELET
Abs Immature Granulocytes: 0.02 10*3/uL (ref 0.00–0.07)
Basophils Absolute: 0 10*3/uL (ref 0.0–0.1)
Basophils Relative: 1 %
Eosinophils Absolute: 0.2 10*3/uL (ref 0.0–0.5)
Eosinophils Relative: 5 %
HCT: 35.4 % — ABNORMAL LOW (ref 36.0–46.0)
Hemoglobin: 11.6 g/dL — ABNORMAL LOW (ref 12.0–15.0)
Immature Granulocytes: 1 %
Lymphocytes Relative: 30 %
Lymphs Abs: 1.2 10*3/uL (ref 0.7–4.0)
MCH: 34.7 pg — ABNORMAL HIGH (ref 26.0–34.0)
MCHC: 32.8 g/dL (ref 30.0–36.0)
MCV: 106 fL — ABNORMAL HIGH (ref 80.0–100.0)
Monocytes Absolute: 0.5 10*3/uL (ref 0.1–1.0)
Monocytes Relative: 12 %
Neutro Abs: 2.1 10*3/uL (ref 1.7–7.7)
Neutrophils Relative %: 51 %
Platelets: 239 10*3/uL (ref 150–400)
RBC: 3.34 MIL/uL — ABNORMAL LOW (ref 3.87–5.11)
RDW: 12.6 % (ref 11.5–15.5)
WBC: 4 10*3/uL (ref 4.0–10.5)
nRBC: 0 % (ref 0.0–0.2)

## 2021-10-03 LAB — COMPREHENSIVE METABOLIC PANEL
ALT: 43 U/L (ref 0–44)
AST: 64 U/L — ABNORMAL HIGH (ref 15–41)
Albumin: 3 g/dL — ABNORMAL LOW (ref 3.5–5.0)
Alkaline Phosphatase: 125 U/L (ref 38–126)
Anion gap: 10 (ref 5–15)
BUN: 8 mg/dL (ref 6–20)
CO2: 22 mmol/L (ref 22–32)
Calcium: 8.1 mg/dL — ABNORMAL LOW (ref 8.9–10.3)
Chloride: 104 mmol/L (ref 98–111)
Creatinine, Ser: 0.76 mg/dL (ref 0.44–1.00)
GFR, Estimated: >60 mL/min (ref >60)
Glucose, Bld: 78 mg/dL (ref 70–99)
Potassium: 3.5 mmol/L (ref 3.5–5.1)
Sodium: 136 mmol/L (ref 135–145)
Total Bilirubin: 0.5 mg/dL (ref 0.3–1.2)
Total Protein: 8.5 g/dL — ABNORMAL HIGH (ref 6.5–8.1)

## 2021-10-03 LAB — LACTIC ACID, PLASMA: Lactic Acid, Venous: 0.9 mmol/L (ref 0.5–1.9)
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
## 2021-10-04 MED ORDER — CEPHALEXIN 250 MG PO CAPS
500.0000 mg | ORAL_CAPSULE | Freq: Once | ORAL | Status: AC
  Administered 2021-10-04: 500 mg via ORAL
  Filled 2021-10-04: qty 2

## 2021-10-04 MED ORDER — CEPHALEXIN 500 MG PO CAPS: 500.0000 mg | ORAL_CAPSULE | Freq: Four times a day (QID) | ORAL | 0 refills | Status: DC

## 2021-10-04 MED ORDER — TETANUS-DIPHTH-ACELL PERTUSSIS 5-2.5-18.5 LF-MCG/0.5 IM SUSY
0.5000 mL | PREFILLED_SYRINGE | Freq: Once | INTRAMUSCULAR | Status: AC
  Administered 2021-10-04: 0.5 mL via INTRAMUSCULAR
  Filled 2021-10-04: qty 0.5

## 2021-10-04 NOTE — ED Notes (Signed)
Pt given peanut butter, graham crackers, apple juice, and cranberry/grape juice ? ?

## 2021-10-04 NOTE — ED Notes (Signed)
Ice water given to pt °
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
## 2021-10-04 NOTE — ED Notes (Signed)
E-signature pad unavailable at time of pt discharge. This RN discussed discharge materials with pt and answered all pt questions. Pt stated understanding of discharge material. ? ?

## 2021-10-04 NOTE — ED Notes (Signed)
[employer] speaking with pt at this time in exam room ?

## 2021-10-04 NOTE — ED Notes (Signed)
With charge nurse approval, cab voucher given to pt ?

## 2021-10-04 NOTE — ED Notes (Signed)
Pt stated to this RN that she has a safe place to go to after discharge from ED.

## 2021-10-24 ENCOUNTER — Emergency Department (HOSPITAL_COMMUNITY): Payer: Medicaid Other | Admitting: Anesthesiology

## 2021-10-24 ENCOUNTER — Other Ambulatory Visit: Payer: Self-pay

## 2021-10-24 ENCOUNTER — Inpatient Hospital Stay (HOSPITAL_COMMUNITY)
Admission: EM | Admit: 2021-10-24 | Discharge: 2021-10-27 | DRG: 142 | Disposition: A | Discharge status: Home / Self Care | Payer: Medicaid Other | Attending: Otolaryngology | Admitting: Otolaryngology

## 2021-10-24 ENCOUNTER — Emergency Department (HOSPITAL_COMMUNITY): Payer: Medicaid Other

## 2021-10-24 ENCOUNTER — Encounter (HOSPITAL_COMMUNITY): Payer: Self-pay | Admitting: Emergency Medicine

## 2021-10-24 ENCOUNTER — Encounter (HOSPITAL_COMMUNITY)
Admission: EM | Disposition: A | Discharge status: Home / Self Care | Payer: Self-pay | Source: Home / Self Care | Attending: Otolaryngology

## 2021-10-24 DIAGNOSIS — S02609B Fracture of mandible, unspecified, initial encounter for open fracture: Secondary | ICD-10-CM | POA: Diagnosis present

## 2021-10-24 DIAGNOSIS — S02601A Fracture of unspecified part of body of right mandible, initial encounter for closed fracture: Secondary | ICD-10-CM | POA: Diagnosis not present

## 2021-10-24 DIAGNOSIS — Z825 Family history of asthma and other chronic lower respiratory diseases: Secondary | ICD-10-CM

## 2021-10-24 DIAGNOSIS — K029 Dental caries, unspecified: Secondary | ICD-10-CM | POA: Diagnosis present

## 2021-10-24 DIAGNOSIS — S02602A Fracture of unspecified part of body of left mandible, initial encounter for closed fracture: Secondary | ICD-10-CM | POA: Diagnosis not present

## 2021-10-24 DIAGNOSIS — F17210 Nicotine dependence, cigarettes, uncomplicated: Secondary | ICD-10-CM | POA: Diagnosis present

## 2021-10-24 DIAGNOSIS — S0269XA Fracture of mandible of other specified site, initial encounter for closed fracture: Secondary | ICD-10-CM

## 2021-10-24 DIAGNOSIS — Z8619 Personal history of other infectious and parasitic diseases: Secondary | ICD-10-CM

## 2021-10-24 DIAGNOSIS — M264 Malocclusion, unspecified: Secondary | ICD-10-CM

## 2021-10-24 DIAGNOSIS — S02672B Fracture of alveolus of left mandible, initial encounter for open fracture: Principal | ICD-10-CM | POA: Diagnosis present

## 2021-10-24 DIAGNOSIS — S02671B Fracture of alveolus of right mandible, initial encounter for open fracture: Secondary | ICD-10-CM | POA: Diagnosis present

## 2021-10-24 DIAGNOSIS — S0083XA Contusion of other part of head, initial encounter: Secondary | ICD-10-CM | POA: Diagnosis present

## 2021-10-24 HISTORY — PX: ORIF MANDIBULAR FRACTURE: SHX2127

## 2021-10-24 HISTORY — DX: Inflammatory liver disease, unspecified: K75.9

## 2021-10-24 HISTORY — DX: Anemia, unspecified: D64.9

## 2021-10-24 HISTORY — DX: Unspecified viral hepatitis C without hepatic coma: B19.20

## 2021-10-24 LAB — CBC WITH DIFFERENTIAL/PLATELET
Abs Immature Granulocytes: 0.01 10*3/uL (ref 0.00–0.07)
Basophils Absolute: 0.1 10*3/uL (ref 0.0–0.1)
Basophils Relative: 1 %
Eosinophils Absolute: 0.3 10*3/uL (ref 0.0–0.5)
Eosinophils Relative: 6 %
HCT: 37.8 % (ref 36.0–46.0)
Hemoglobin: 12.3 g/dL (ref 12.0–15.0)
Immature Granulocytes: 0 %
Lymphocytes Relative: 39 %
Lymphs Abs: 1.9 10*3/uL (ref 0.7–4.0)
MCH: 34.2 pg — ABNORMAL HIGH (ref 26.0–34.0)
MCHC: 32.5 g/dL (ref 30.0–36.0)
MCV: 105 fL — ABNORMAL HIGH (ref 80.0–100.0)
Monocytes Absolute: 0.6 10*3/uL (ref 0.1–1.0)
Monocytes Relative: 12 %
Neutro Abs: 2.1 10*3/uL (ref 1.7–7.7)
Neutrophils Relative %: 42 %
Platelets: 237 10*3/uL (ref 150–400)
RBC: 3.6 MIL/uL — ABNORMAL LOW (ref 3.87–5.11)
RDW: 13.4 % (ref 11.5–15.5)
WBC: 5 10*3/uL (ref 4.0–10.5)
nRBC: 0 % (ref 0.0–0.2)

## 2021-10-24 LAB — COMPREHENSIVE METABOLIC PANEL
ALT: 109 U/L — ABNORMAL HIGH (ref 0–44)
AST: 189 U/L — ABNORMAL HIGH (ref 15–41)
Albumin: 3.3 g/dL — ABNORMAL LOW (ref 3.5–5.0)
Alkaline Phosphatase: 119 U/L (ref 38–126)
Anion gap: 9 (ref 5–15)
BUN: 9 mg/dL (ref 6–20)
CO2: 21 mmol/L — ABNORMAL LOW (ref 22–32)
Calcium: 8 mg/dL — ABNORMAL LOW (ref 8.9–10.3)
Chloride: 104 mmol/L (ref 98–111)
Creatinine, Ser: 0.74 mg/dL (ref 0.44–1.00)
GFR, Estimated: >60 mL/min (ref >60)
Glucose, Bld: 97 mg/dL (ref 70–99)
Potassium: 3.9 mmol/L (ref 3.5–5.1)
Sodium: 134 mmol/L — ABNORMAL LOW (ref 135–145)
Total Bilirubin: 0.6 mg/dL (ref 0.3–1.2)
Total Protein: 9.2 g/dL — ABNORMAL HIGH (ref 6.5–8.1)

## 2021-10-24 SURGERY — OPEN REDUCTION INTERNAL FIXATION (ORIF) MANDIBULAR FRACTURE
Anesthesia: General

## 2021-10-24 MED ORDER — ONDANSETRON 4 MG PO TBDP
4.0000 mg | ORAL_TABLET | Freq: Four times a day (QID) | ORAL | Status: DC | PRN
  Filled 2021-10-24: qty 1

## 2021-10-24 MED ORDER — PROPOFOL 10 MG/ML IV BOLUS
INTRAVENOUS | Status: AC
  Filled 2021-10-24: qty 20

## 2021-10-24 MED ORDER — CEPHALEXIN 500 MG PO CAPS: 500.0000 mg | ORAL_CAPSULE | Freq: Three times a day (TID) | ORAL | Status: DC

## 2021-10-24 MED ORDER — SODIUM CHLORIDE 0.9 % IV BOLUS
1000.0000 mL | Freq: Once | INTRAVENOUS | Status: AC
  Administered 2021-10-24: 1000 mL via INTRAVENOUS

## 2021-10-24 MED ORDER — DEXMEDETOMIDINE (PRECEDEX) IN NS 20 MCG/5ML (4 MCG/ML) IV SYRINGE
PREFILLED_SYRINGE | INTRAVENOUS | Status: DC | PRN
Start: 2021-10-24 — End: 2021-10-24
  Administered 2021-10-24: 8 ug via INTRAVENOUS

## 2021-10-24 MED ORDER — FENTANYL CITRATE (PF) 100 MCG/2ML IJ SOLN
INTRAMUSCULAR | Status: AC
  Filled 2021-10-24: qty 2

## 2021-10-24 MED ORDER — EPINEPHRINE PF 1 MG/ML IJ SOLN
INTRAMUSCULAR | Status: AC
  Filled 2021-10-24: qty 1

## 2021-10-24 MED ORDER — LIDOCAINE-EPINEPHRINE 1 %-1:100000 IJ SOLN
INTRAMUSCULAR | Status: AC
  Filled 2021-10-24: qty 1

## 2021-10-24 MED ORDER — MORPHINE SULFATE (PF) 2 MG/ML IV SOLN
2.0000 mg | INTRAVENOUS | Status: DC | PRN
  Administered 2021-10-24: 2 mg via INTRAVENOUS
  Administered 2021-10-24 – 2021-10-26 (×7): 4 mg via INTRAVENOUS
  Administered 2021-10-26 (×3): 2 mg via INTRAVENOUS
  Administered 2021-10-26: 4 mg via INTRAVENOUS
  Administered 2021-10-26 – 2021-10-27 (×6): 2 mg via INTRAVENOUS
  Filled 2021-10-24 (×4): qty 1
  Filled 2021-10-24: qty 2
  Filled 2021-10-24 (×2): qty 1
  Filled 2021-10-24 (×3): qty 2
  Filled 2021-10-24: qty 1
  Filled 2021-10-24 (×2): qty 2
  Filled 2021-10-24: qty 1
  Filled 2021-10-24 (×2): qty 2
  Filled 2021-10-24 (×2): qty 1

## 2021-10-24 MED ORDER — FENTANYL CITRATE (PF) 250 MCG/5ML IJ SOLN
INTRAMUSCULAR | Status: DC | PRN
  Administered 2021-10-24: 100 ug via INTRAVENOUS
  Administered 2021-10-24: 50 ug via INTRAVENOUS

## 2021-10-24 MED ORDER — ROCURONIUM BROMIDE 10 MG/ML (PF) SYRINGE
PREFILLED_SYRINGE | INTRAVENOUS | Status: DC | PRN
  Administered 2021-10-24: 50 mg via INTRAVENOUS

## 2021-10-24 MED ORDER — PHENYLEPHRINE 40 MCG/ML (10ML) SYRINGE FOR IV PUSH (FOR BLOOD PRESSURE SUPPORT)
PREFILLED_SYRINGE | INTRAVENOUS | Status: DC | PRN
  Administered 2021-10-24 (×3): 120 ug via INTRAVENOUS

## 2021-10-24 MED ORDER — ACETAMINOPHEN 160 MG/5ML PO SOLN: 1000.0000 mg | Freq: Once | ORAL | Status: DC | PRN

## 2021-10-24 MED ORDER — OXYCODONE HCL 5 MG PO TABS: 5.0000 mg | ORAL_TABLET | Freq: Once | ORAL | Status: DC | PRN

## 2021-10-24 MED ORDER — SODIUM CHLORIDE 0.9 % IV SOLN
3.0000 g | Freq: Once | INTRAVENOUS | Status: AC
  Administered 2021-10-24: 3 g via INTRAVENOUS
  Filled 2021-10-24: qty 8

## 2021-10-24 MED ORDER — OXYCODONE HCL 5 MG/5ML PO SOLN: 5.0000 mg | Freq: Once | ORAL | Status: DC | PRN

## 2021-10-24 MED ORDER — HYDROMORPHONE HCL 1 MG/ML IJ SOLN
1.0000 mg | Freq: Once | INTRAMUSCULAR | Status: AC
  Administered 2021-10-24: 1 mg via INTRAVENOUS
  Filled 2021-10-24: qty 1

## 2021-10-24 MED ORDER — FENTANYL CITRATE (PF) 100 MCG/2ML IJ SOLN
25.0000 ug | INTRAMUSCULAR | Status: DC | PRN
  Administered 2021-10-24 (×2): 25 ug via INTRAVENOUS

## 2021-10-24 MED ORDER — MIDAZOLAM HCL 2 MG/2ML IJ SOLN
INTRAMUSCULAR | Status: AC
  Filled 2021-10-24: qty 2

## 2021-10-24 MED ORDER — MIDAZOLAM HCL 2 MG/2ML IJ SOLN
INTRAMUSCULAR | Status: DC | PRN
  Administered 2021-10-24: 2 mg via INTRAVENOUS

## 2021-10-24 MED ORDER — ACETAMINOPHEN 500 MG PO TABS: 1000.0000 mg | ORAL_TABLET | Freq: Once | ORAL | Status: DC | PRN

## 2021-10-24 MED ORDER — PROPOFOL 10 MG/ML IV BOLUS
INTRAVENOUS | Status: DC | PRN
  Administered 2021-10-24: 100 mg via INTRAVENOUS
  Administered 2021-10-24: 40 mg via INTRAVENOUS

## 2021-10-24 MED ORDER — FENTANYL CITRATE (PF) 250 MCG/5ML IJ SOLN
INTRAMUSCULAR | Status: AC
  Filled 2021-10-24: qty 5

## 2021-10-24 MED ORDER — SUCCINYLCHOLINE CHLORIDE 200 MG/10ML IV SOSY
PREFILLED_SYRINGE | INTRAVENOUS | Status: DC | PRN
  Administered 2021-10-24: 100 mg via INTRAVENOUS

## 2021-10-24 MED ORDER — LIDOCAINE 2% (20 MG/ML) 5 ML SYRINGE
INTRAMUSCULAR | Status: DC | PRN
  Administered 2021-10-24: 60 mg via INTRAVENOUS

## 2021-10-24 MED ORDER — LACTATED RINGERS IV SOLN: INTRAVENOUS | Status: DC

## 2021-10-24 MED ORDER — SUGAMMADEX SODIUM 200 MG/2ML IV SOLN
INTRAVENOUS | Status: DC | PRN
  Administered 2021-10-24: 200 mg via INTRAVENOUS

## 2021-10-24 MED ORDER — ACETAMINOPHEN 10 MG/ML IV SOLN: 1000.0000 mg | Freq: Once | INTRAVENOUS | Status: DC | PRN

## 2021-10-24 MED ORDER — ONDANSETRON HCL 4 MG/2ML IJ SOLN
4.0000 mg | Freq: Four times a day (QID) | INTRAMUSCULAR | Status: DC | PRN
  Administered 2021-10-24: 4 mg via INTRAVENOUS
  Filled 2021-10-24: qty 2

## 2021-10-24 MED ORDER — ONDANSETRON HCL 4 MG/2ML IJ SOLN
INTRAMUSCULAR | Status: DC | PRN
  Administered 2021-10-24: 4 mg via INTRAVENOUS

## 2021-10-24 MED ORDER — LIDOCAINE-EPINEPHRINE 1 %-1:100000 IJ SOLN
INTRAMUSCULAR | Status: DC | PRN
  Administered 2021-10-24: 2 mL

## 2021-10-24 MED ORDER — SODIUM CHLORIDE 0.9 % IV SOLN
3.0000 g | Freq: Four times a day (QID) | INTRAVENOUS | Status: DC
  Administered 2021-10-24 – 2021-10-27 (×12): 3 g via INTRAVENOUS
  Filled 2021-10-24 (×15): qty 8

## 2021-10-24 MED ORDER — OXYMETAZOLINE HCL 0.05 % NA SOLN
NASAL | Status: AC
  Filled 2021-10-24: qty 30

## 2021-10-24 MED ORDER — OXYCODONE-ACETAMINOPHEN 5-325 MG PO TABS
1.0000 | ORAL_TABLET | ORAL | Status: DC | PRN
  Filled 2021-10-24: qty 2

## 2021-10-24 MED ORDER — BACITRACIN ZINC 500 UNIT/GM EX OINT
TOPICAL_OINTMENT | CUTANEOUS | Status: AC
  Filled 2021-10-24: qty 28.35

## 2021-10-24 MED ORDER — DEXAMETHASONE SODIUM PHOSPHATE 10 MG/ML IJ SOLN
INTRAMUSCULAR | Status: DC | PRN
  Administered 2021-10-24: 10 mg via INTRAVENOUS

## 2021-10-24 SURGICAL SUPPLY — 46 items
BAG COUNTER SPONGE SURGICOUNT (BAG) ×2 IMPLANT
BAG SPNG CNTER NS LX DISP (BAG) ×1
BAR ARCH PREFORM MXLMNDB FX (Miscellaneous) ×2 IMPLANT
BAR FIX PREFORMED OMNIMAX (Miscellaneous) ×2 IMPLANT
BIT DRILL 1.6X115 (BIT) ×1
BIT DRILL 1.6X115MM (BIT) IMPLANT
BLADE MANDIBULAR SCREWDRIVER (BLADE) ×1 IMPLANT
BLADE SURG 10 STRL SS (BLADE) IMPLANT
BLADE SURG 15 STRL LF DISP TIS (BLADE) IMPLANT
BLADE SURG 15 STRL SS (BLADE)
CANISTER SUCT 3000ML PPV (MISCELLANEOUS) ×2 IMPLANT
COVER SURGICAL LIGHT HANDLE (MISCELLANEOUS) ×3 IMPLANT
DRAPE HALF SHEET 40X57 (DRAPES) IMPLANT
DRILL BIT 1.6X115MM (BIT) ×2
ELECT COATED BLADE 2.86 ST (ELECTRODE) IMPLANT
ELECT NDL BLADE 2-5/6 (NEEDLE) IMPLANT
ELECT NEEDLE BLADE 2-5/6 (NEEDLE) ×2 IMPLANT
ELECT REM PT RETURN 9FT ADLT (ELECTROSURGICAL) ×2
ELECTRODE REM PT RTRN 9FT ADLT (ELECTROSURGICAL) ×1 IMPLANT
GLOVE SURG LTX SZ7.5 (GLOVE) ×2 IMPLANT
GOWN STRL REUS W/ TWL LRG LVL3 (GOWN DISPOSABLE) ×2 IMPLANT
GOWN STRL REUS W/TWL LRG LVL3 (GOWN DISPOSABLE) ×4
KIT BASIN OR (CUSTOM PROCEDURE TRAY) ×2 IMPLANT
KIT TURNOVER KIT B (KITS) ×2 IMPLANT
NDL HYPO 25GX1X1/2 BEV (NEEDLE) IMPLANT
NEEDLE HYPO 25GX1X1/2 BEV (NEEDLE) ×2 IMPLANT
NS IRRIG 1000ML POUR BTL (IV SOLUTION) ×2 IMPLANT
PAD ARMBOARD 7.5X6 YLW CONV (MISCELLANEOUS) ×3 IMPLANT
PATTIES SURGICAL .5 X3 (DISPOSABLE) IMPLANT
PENCIL FOOT CONTROL (ELECTRODE) ×2 IMPLANT
PLATE 4HOLE STR 1.6MM (Plate) ×1 IMPLANT
PLATE LOCK 4H W/GAP 1.0 GRD 3 (Plate) ×1 IMPLANT
POSITIONER HEAD DONUT 9IN (MISCELLANEOUS) ×1 IMPLANT
SCISSORS WIRE ANG 4 3/4 DISP (INSTRUMENTS) IMPLANT
SCREW BONE 2X7 CROSS DRIVE (Screw) ×8 IMPLANT
SCREW LOCKING X-DR 2.0X12 (Screw) ×4 IMPLANT
SCREW NLOCK LORENZ 2X5 (Screw) ×4 IMPLANT
SUT CHROMIC 3 0 PS 2 (SUTURE) ×2 IMPLANT
SUT CHROMIC 4 0 PS 2 18 (SUTURE) ×2 IMPLANT
SUT SILK 2 0 PERMA HAND 18 BK (SUTURE) IMPLANT
SUT STEEL 0 (SUTURE)
SUT STEEL 0 18XMFL TIE 17 (SUTURE) IMPLANT
SUT STEEL 4 (SUTURE) ×1 IMPLANT
TOWEL GREEN STERILE FF (TOWEL DISPOSABLE) ×2 IMPLANT
TRAY ENT MC OR (CUSTOM PROCEDURE TRAY) ×2 IMPLANT
WIRE 24 GAUGE OMINIMAX MMF (WIRE) ×1 IMPLANT

## 2021-10-24 NOTE — Anesthesia Preprocedure Evaluation (Signed)
Anesthesia Evaluation  ?Patient identified by MRN, date of birth, ID band ?Patient awake ? ? ? ?Reviewed: ?Allergy & Precautions, NPO status , Patient's Chart, lab work & pertinent test results ? ?History of Anesthesia Complications ?Negative for: history of anesthetic complications ? ?Airway ?Mallampati: IV ? ? ?Neck ROM: Full ? ?Mouth opening: Limited Mouth Opening ? Dental ? ?(+) Dental Advisory Given ?  ?Pulmonary ?Current Smoker,  ?  ?breath sounds clear to auscultation ? ? ? ? ? ? Cardiovascular ?negative cardio ROS ? ? ?Rhythm:Regular  ? ?  ?Neuro/Psych ?negative neurological ROS ? negative psych ROS  ? GI/Hepatic ?negative GI ROS, (+) Hepatitis -, C  ?Endo/Other  ?negative endocrine ROS ? Renal/GU ?Renal diseaseLab Results ?     Component                Value               Date                 ?     CREATININE               0.74                10/24/2021           ?  ? ?  ?Musculoskeletal ?negative musculoskeletal ROS ?(+)  ? Abdominal ?  ?Peds ? Hematology ?negative hematology ROS ?(+) Lab Results ?     Component                Value               Date                 ?     WBC                      5.0                 10/24/2021           ?     HGB                      12.3                10/24/2021           ?     HCT                      37.8                10/24/2021           ?     MCV                      105.0 (H)           10/24/2021           ?     PLT                      237                 10/24/2021           ?   ?Anesthesia Other Findings ?Obvious jaw deformity ? Reproductive/Obstetrics ? ?  ? ? ? ? ? ? ? ? ? ? ? ? ? ?  ?  ? ? ? ? ? ? ? ? ?  Anesthesia Physical ?Anesthesia Plan ? ?ASA: 2 ? ?Anesthesia Plan: General  ? ?Post-op Pain Management: Ofirmev IV (intra-op)* and Toradol IV (intra-op)*  ? ?Induction: Intravenous ? ?PONV Risk Score and Plan: 2 and Ondansetron and Dexamethasone ? ?Airway Management Planned: Nasal ETT and Video Laryngoscope  Planned ? ?Additional Equipment: None ? ?Intra-op Plan:  ? ?Post-operative Plan: Extubation in OR and Possible Post-op intubation/ventilation ? ?Informed Consent: I have reviewed the patients History and Physical, chart, labs and discussed the procedure including the risks, benefits and alternatives for the proposed anesthesia with the patient or authorized representative who has indicated his/her understanding and acceptance.  ? ? ? ?Dental advisory given ? ?Plan Discussed with: CRNA and Surgeon ? ?Anesthesia Plan Comments:   ? ? ? ? ? ? ?Anesthesia Quick Evaluation ? ?
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
## 2021-10-24 NOTE — Anesthesia Procedure Notes (Signed)
Procedure Name: Intubation ?Date/Time: 10/24/2021 1:13 PM ?Performed by: Griffin Dakin, CRNA ?Pre-anesthesia Checklist: Patient identified, Emergency Drugs available, Suction available and Patient being monitored ?Patient Re-evaluated:Patient Re-evaluated prior to induction ?Oxygen Delivery Method: Circle system utilized ?Preoxygenation: Pre-oxygenation with 100% oxygen ?Induction Type: IV induction ?Ventilation: Mask ventilation without difficulty ?Laryngoscope Size: Glidescope and 3 ?Grade View: Grade I ?Nasal Tubes: Right, Magill forceps - small, utilized and Nasal Dwyane Luo ?Tube size: 7.0 mm ?Number of attempts: 1 ?Airway Equipment and Method: Stylet and Oral airway ?Placement Confirmation: ETT inserted through vocal cords under direct vision, positive ETCO2 and breath sounds checked- equal and bilateral ?Tube secured with: Tape ?Dental Injury: Teeth and Oropharynx as per pre-operative assessment  ? ? ? ? ?
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
## 2021-10-24 NOTE — ED Notes (Signed)
Patient transported to CT 

## 2021-10-24 NOTE — Anesthesia Postprocedure Evaluation (Signed)
Anesthesia Post Note ? ?Patient: Isabella Powell ? ?Procedure(s) Performed: OPEN REDUCTION INTERNAL FIXATION (ORIF) MANDIBULAR FRACTURE ? ?  ? ?Patient location during evaluation: PACU ?Anesthesia Type: General ?Level of consciousness: patient cooperative and awake ?Pain management: pain level controlled ?Vital Signs Assessment: post-procedure vital signs reviewed and stable ?Respiratory status: spontaneous breathing, nonlabored ventilation and respiratory function stable ?Cardiovascular status: blood pressure returned to baseline and stable ?Postop Assessment: no apparent nausea or vomiting ?Anesthetic complications: no ? ? ?No notable events documented. ? ?Last Vitals:  ?Vitals:  ? 10/24/21 1704 10/24/21 1719  ?BP: 132/88 136/71  ?Pulse: 81 78  ?Resp: 11 13  ?Temp:    ?SpO2: 100% 100%  ?  ?Last Pain:  ?Vitals:  ? 10/24/21 1634  ?TempSrc:   ?PainSc: Asleep  ? ? ?  ?  ?  ?  ?  ?  ? ?[name] ? ? ? ? ?

## 2021-10-24 NOTE — Transfer of Care (Signed)
Immediate Anesthesia Transfer of Care Note ? ?Patient: Isabella Powell ? ?Procedure(s) Performed: OPEN REDUCTION INTERNAL FIXATION (ORIF) MANDIBULAR FRACTURE ? ?Patient Location: PACU ? ?Anesthesia Type:General ? ?Level of Consciousness: awake, alert  and oriented ? ?Airway & Oxygen Therapy: Patient Spontanous Breathing ? ?Post-op Assessment: Report given to RN and Post -op Vital signs reviewed and stable ? ?Post vital signs: Reviewed and stable ? ?Last Vitals:  ?Vitals Value Taken Time  ?BP    ?Temp    ?Pulse    ?Resp    ?SpO2    ? ? ?Last Pain:  ?Vitals:  ? 10/24/21 0433  ?TempSrc: Oral  ?   ? ?  ? ?Complications: No notable events documented. ?
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
## 2021-10-25 ENCOUNTER — Encounter (HOSPITAL_COMMUNITY): Payer: Self-pay | Admitting: Otolaryngology

## 2021-10-25 ENCOUNTER — Observation Stay (HOSPITAL_COMMUNITY): Payer: Medicaid Other

## 2021-10-25 DIAGNOSIS — K029 Dental caries, unspecified: Secondary | ICD-10-CM | POA: Diagnosis present

## 2021-10-25 DIAGNOSIS — F17210 Nicotine dependence, cigarettes, uncomplicated: Secondary | ICD-10-CM | POA: Diagnosis present

## 2021-10-25 DIAGNOSIS — S02672B Fracture of alveolus of left mandible, initial encounter for open fracture: Secondary | ICD-10-CM | POA: Diagnosis present

## 2021-10-25 DIAGNOSIS — Z8619 Personal history of other infectious and parasitic diseases: Secondary | ICD-10-CM | POA: Diagnosis not present

## 2021-10-25 DIAGNOSIS — S02601A Fracture of unspecified part of body of right mandible, initial encounter for closed fracture: Secondary | ICD-10-CM | POA: Diagnosis not present

## 2021-10-25 DIAGNOSIS — S02602A Fracture of unspecified part of body of left mandible, initial encounter for closed fracture: Secondary | ICD-10-CM | POA: Diagnosis not present

## 2021-10-25 DIAGNOSIS — S0083XA Contusion of other part of head, initial encounter: Secondary | ICD-10-CM | POA: Diagnosis present

## 2021-10-25 DIAGNOSIS — Z825 Family history of asthma and other chronic lower respiratory diseases: Secondary | ICD-10-CM | POA: Diagnosis not present

## 2021-10-25 DIAGNOSIS — S02671B Fracture of alveolus of right mandible, initial encounter for open fracture: Secondary | ICD-10-CM | POA: Diagnosis present

## 2021-10-25 MED ORDER — CHLORHEXIDINE GLUCONATE CLOTH 2 % EX PADS
6.0000 | MEDICATED_PAD | Freq: Once | CUTANEOUS | Status: AC
  Administered 2021-10-26: 6 via TOPICAL

## 2021-10-25 MED ORDER — WHITE PETROLATUM EX OINT
TOPICAL_OINTMENT | CUTANEOUS | Status: DC | PRN
  Filled 2021-10-25 (×2): qty 28.35

## 2021-10-25 MED ORDER — DIPHENHYDRAMINE HCL 50 MG/ML IJ SOLN
25.0000 mg | Freq: Four times a day (QID) | INTRAMUSCULAR | Status: DC | PRN
  Administered 2021-10-25 (×3): 25 mg via INTRAVENOUS
  Administered 2021-10-26 (×2): 50 mg via INTRAVENOUS
  Administered 2021-10-27: 25 mg via INTRAVENOUS
  Administered 2021-10-27: 50 mg via INTRAVENOUS
  Filled 2021-10-25 (×7): qty 1

## 2021-10-25 MED ORDER — CHLORHEXIDINE GLUCONATE CLOTH 2 % EX PADS: 6.0000 | MEDICATED_PAD | Freq: Once | CUTANEOUS | Status: DC

## 2021-10-25 MED ORDER — DIPHENHYDRAMINE HCL 25 MG PO CAPS
25.0000 mg | ORAL_CAPSULE | Freq: Four times a day (QID) | ORAL | Status: DC | PRN
  Filled 2021-10-25: qty 2

## 2021-10-25 NOTE — TOC Initial Note (Signed)
Transition of Care (TOC) - Initial/Assessment Note  ? ? ?Patient Details  ?Name: Isabella Powell ?MRN: [id number] ?Date of Birth: [birth year] ? ?Transition of Care (TOC) CM/SW Contact:    ?Vinie Sill, LCSW ?Phone Number: ?10/25/2021, 4:49 PM ? ?Clinical Narrative:                 ? ?CSW received message, from [place], patient requested housing resources. CSW met with patient at bedside. CSW introduced self and explained reason for visit.  ?Patient states she lives in a shed but she has access to food, water , bathrooms, etc. She pays 150.00 for shelter. She receives food stamps and SSI for her twins. She reports no other income. She reports use of alcohol,marijuana and occassionally cocaine. SA resources where offered by CSW Carter-Tarpley.  ? ?Patient expressed interest in receiving housing resources. CSW advised, unable to assist with finding housing but can complete [employer] [facility] Application who can  assist with securing affordable housing and link her with resources in the community.  ? ?CSW started the [employer] application and will complete application tomorrow.  ?  ?  ? ? ?Patient Goals and CMS Choice ?  ?  ?  ? ?Expected Discharge Plan and Services ?  ?  ?  ?  ?  ?                ?  ?  ?  ?  ?  ?  ?  ?  ?  ?  ? ?Prior Living Arrangements/Services ?  ?Lives with:: Self, Minor Children ?Patient language and need for interpreter reviewed:: No ?       ?Need for Family Participation in Patient Care: Yes (Comment) ?Care giver support system in place?: Yes (comment) ?  ?Criminal Activity/Legal Involvement Pertinent to Current Situation/Hospitalization: No - Comment as needed ? ?Activities of Daily Living ?Home Assistive Devices/Equipment: None ?ADL Screening (condition at time of admission) ?Patient's cognitive ability adequate to safely complete daily activities?: No ?Is the patient deaf or have difficulty hearing?: No ?Does the patient have difficulty seeing, even when wearing  glasses/contacts?: No ?Does the patient have difficulty concentrating, remembering, or making decisions?: No ?Patient able to express need for assistance with ADLs?: No ?Does the patient have difficulty dressing or bathing?: No ?Independently performs ADLs?: No ?Communication: Independent ?Dressing (OT): Independent ?Grooming: Independent ?Feeding: Independent ?Bathing: Independent ?Toileting: Independent ?In/Out Bed: Independent ?Walks in Home: Independent ?Does the patient have difficulty walking or climbing stairs?: No ?Weakness of Legs: None ?Weakness of Arms/Hands: None ? ?Permission Sought/Granted ?Permission sought to share information with : Family Supports ?Permission granted to share information with : Yes, Verbal Permission Granted ? Share Information with NAME: Isabella Powell ? Permission granted to share info w AGENCY: w ? Permission granted to share info w Relationship: daughter ? Permission granted to share info w Contact Information: [phone] ? ?Emotional Assessment ?Appearance:: Appears stated age ?Attitude/Demeanor/Rapport: Engaged ?Affect (typically observed): Pleasant, Appropriate ?Orientation: : Oriented to Self, Oriented to Place, Oriented to  Time, Oriented to Situation ?Alcohol / Substance Use: Illicit Drugs, Alcohol Use, Tobacco Use ?Psych Involvement: No (comment) ? ?Admission diagnosis:  Facial hematoma, initial encounter [S00.83XA] ?Bilateral open fracture of mandible, initial encounter ([place]) [S02.609B] ?Mandible open fracture ([place]) [S02.609B] ?Patient Active Problem List  ? Diagnosis Date Noted  ? Mandible open fracture ([place]) 10/24/2021  ? Tobacco abuse 10/08/2016  ? Sepsis ([place]) 10/07/2016  ? Acute lower UTI 10/07/2016  ? Community acquired pneumonia of right lower  lobe of lung 10/07/2016  ? ?PCP:  [facility] ?Pharmacy:   ?[id number] [place], [place] - [address] AT [address] [address]?Phone: [phone]: [phone]? ?[address] [place]?[place]?Phone: [phone]: [phone]? ?[id number] [place] [address] [address]: [phone]: [phone] ?[phone] [address]: [phone]: [phone] ? ? ? ?Social Determinants of Health (SDOH) Interventions ?  ? ?Readmission Risk Interventions ?   ? View : No data to display.  ?  ?  ?  ? ? ? ?

## 2021-10-25 NOTE — TOC CAGE-AID Note (Signed)
Transition of Care (TOC) - CAGE-AID Screening ? ? ?Patient Details  ?Name: Isabella Powell ?MRN: [id number] ?Date of Birth: 03-08-1971 ? ?Transition of Care (TOC) CM/SW Contact:    ?Lilymarie Scroggins C Tarpley-Carter, LCSWA ?Phone Number: ?10/25/2021, 2:48 PM ? ? ?Clinical Narrative: ?Pt participated in [place].  Pt stated she does use cocaine, marijuana, and drinks ETOH socially.  Pt was offered resources, due to usage of substance and ETOH.  ? ?Passenger transport manager, MSW, LCSW-A ?Pronouns:  She/Her/Hers ?[facility] ?Clinical Social Worker ?Direct Number:  [phone] ?[name]    ? ?CAGE-AID Screening: ?  ? ?Have You Ever Felt You Ought to Cut Down on Your Drinking or Drug Use?: Yes ?Have People Annoyed You By Critizing Your Drinking Or Drug Use?: No ?Have You Felt Bad Or Guilty About Your Drinking Or Drug Use?: Yes ?Have You Ever Had a Drink or Used Drugs First Thing In The Morning to Steady Your Nerves or to Get Rid of a Hangover?: No ?CAGE-AID Score: 2 ? ?Substance Abuse Education Offered: Yes ? ?Substance abuse interventions: Educational Materials ? ? ? ? ? ? ?

## 2021-10-25 NOTE — H&P (View-Only) (Signed)
Patient ID: Isabella Powell, female   DOB: March 14, 1971, 51 y.o.   MRN: [id number] ?She is doing well.  Still quite swollen. ?The wires are tight on she looks good. ?I discussed the case with the terrible dentition with oral surgery.  They feel like while she is in the hospital and for best chance of this fracture healing that some of the teeth need to be extracted.  This was scheduled for tomorrow afternoon.  The patient is agreeable. ?

## 2021-10-25 NOTE — Progress Notes (Signed)
Patient ID: Isabella Powell, female   DOB: 03/17/1971, 50 y.o.   MRN: [id number] ?She is doing well.  Still quite swollen. ?The wires are tight on she looks good. ?I discussed the case with the terrible dentition with oral surgery.  They feel like while she is in the hospital and for best chance of this fracture healing that some of the teeth need to be extracted.  This was scheduled for tomorrow afternoon.  The patient is agreeable. ?

## 2021-10-26 ENCOUNTER — Inpatient Hospital Stay (HOSPITAL_COMMUNITY): Payer: Medicaid Other | Admitting: Certified Registered Nurse Anesthetist

## 2021-10-26 ENCOUNTER — Encounter (HOSPITAL_COMMUNITY): Payer: Self-pay | Admitting: Otolaryngology

## 2021-10-26 ENCOUNTER — Encounter (HOSPITAL_COMMUNITY)
Admission: EM | Disposition: A | Discharge status: Home / Self Care | Payer: Self-pay | Source: Home / Self Care | Attending: Otolaryngology

## 2021-10-26 ENCOUNTER — Other Ambulatory Visit: Payer: Self-pay

## 2021-10-26 DIAGNOSIS — S02601A Fracture of unspecified part of body of right mandible, initial encounter for closed fracture: Secondary | ICD-10-CM

## 2021-10-26 DIAGNOSIS — S02602A Fracture of unspecified part of body of left mandible, initial encounter for closed fracture: Secondary | ICD-10-CM

## 2021-10-26 DIAGNOSIS — K029 Dental caries, unspecified: Secondary | ICD-10-CM

## 2021-10-26 HISTORY — PX: TOOTH EXTRACTION: SHX859

## 2021-10-26 SURGERY — DENTAL RESTORATION/EXTRACTIONS
Anesthesia: General | Site: Mouth

## 2021-10-26 MED ORDER — DEXAMETHASONE SODIUM PHOSPHATE 10 MG/ML IJ SOLN
INTRAMUSCULAR | Status: DC | PRN
Start: 2021-10-26 — End: 2021-10-26
  Administered 2021-10-26: 10 mg via INTRAVENOUS

## 2021-10-26 MED ORDER — MEPERIDINE HCL 25 MG/ML IJ SOLN: 6.2500 mg | INTRAMUSCULAR | Status: DC | PRN

## 2021-10-26 MED ORDER — FENTANYL CITRATE (PF) 100 MCG/2ML IJ SOLN: 25.0000 ug | INTRAMUSCULAR | Status: DC | PRN

## 2021-10-26 MED ORDER — PHENYLEPHRINE 40 MCG/ML (10ML) SYRINGE FOR IV PUSH (FOR BLOOD PRESSURE SUPPORT)
PREFILLED_SYRINGE | INTRAVENOUS | Status: DC | PRN
  Administered 2021-10-26: 80 ug via INTRAVENOUS

## 2021-10-26 MED ORDER — MIDAZOLAM HCL 2 MG/2ML IJ SOLN
INTRAMUSCULAR | Status: AC
  Filled 2021-10-26: qty 2

## 2021-10-26 MED ORDER — ACETAMINOPHEN 10 MG/ML IV SOLN
INTRAVENOUS | Status: DC | PRN
  Administered 2021-10-26: 1000 mg via INTRAVENOUS

## 2021-10-26 MED ORDER — LIDOCAINE 2% (20 MG/ML) 5 ML SYRINGE
INTRAMUSCULAR | Status: AC
  Filled 2021-10-26: qty 5

## 2021-10-26 MED ORDER — PROPOFOL 10 MG/ML IV BOLUS
INTRAVENOUS | Status: AC
Start: 2021-10-26 — End: ?
  Filled 2021-10-26: qty 20

## 2021-10-26 MED ORDER — SUCCINYLCHOLINE CHLORIDE 200 MG/10ML IV SOSY
PREFILLED_SYRINGE | INTRAVENOUS | Status: DC | PRN
  Administered 2021-10-26: 160 mg via INTRAVENOUS

## 2021-10-26 MED ORDER — LIDOCAINE-EPINEPHRINE 2 %-1:100000 IJ SOLN
INTRAMUSCULAR | Status: AC
  Filled 2021-10-26: qty 1

## 2021-10-26 MED ORDER — PROPOFOL 10 MG/ML IV BOLUS
INTRAVENOUS | Status: AC
  Filled 2021-10-26: qty 20

## 2021-10-26 MED ORDER — 0.9 % SODIUM CHLORIDE (POUR BTL) OPTIME
TOPICAL | Status: DC | PRN
  Administered 2021-10-26: 1000 mL

## 2021-10-26 MED ORDER — ONDANSETRON HCL 4 MG/2ML IJ SOLN
INTRAMUSCULAR | Status: DC | PRN
  Administered 2021-10-26: 4 mg via INTRAVENOUS

## 2021-10-26 MED ORDER — FENTANYL CITRATE (PF) 250 MCG/5ML IJ SOLN
INTRAMUSCULAR | Status: DC | PRN
  Administered 2021-10-26: 150 ug via INTRAVENOUS
  Administered 2021-10-26: 50 ug via INTRAVENOUS

## 2021-10-26 MED ORDER — ACETAMINOPHEN 10 MG/ML IV SOLN: 1000.0000 mg | Freq: Once | INTRAVENOUS | Status: DC | PRN

## 2021-10-26 MED ORDER — OXYCODONE HCL 5 MG PO TABS: 5.0000 mg | ORAL_TABLET | Freq: Once | ORAL | Status: DC | PRN

## 2021-10-26 MED ORDER — CHLORHEXIDINE GLUCONATE 0.12 % MT SOLN: 15.0000 mL | Freq: Once | OROMUCOSAL | Status: DC

## 2021-10-26 MED ORDER — ACETAMINOPHEN 10 MG/ML IV SOLN
INTRAVENOUS | Status: AC
  Filled 2021-10-26: qty 100

## 2021-10-26 MED ORDER — FENTANYL CITRATE (PF) 250 MCG/5ML IJ SOLN
INTRAMUSCULAR | Status: AC
  Filled 2021-10-26: qty 5

## 2021-10-26 MED ORDER — LIDOCAINE-EPINEPHRINE 2 %-1:100000 IJ SOLN
INTRAMUSCULAR | Status: DC | PRN
  Administered 2021-10-26: 5 mL via INTRADERMAL

## 2021-10-26 MED ORDER — ORAL CARE MOUTH RINSE: 15.0000 mL | Freq: Once | OROMUCOSAL | Status: DC

## 2021-10-26 MED ORDER — LIDOCAINE 2% (20 MG/ML) 5 ML SYRINGE
INTRAMUSCULAR | Status: DC | PRN
  Administered 2021-10-26: 100 mg via INTRAVENOUS

## 2021-10-26 MED ORDER — OXYCODONE HCL 5 MG/5ML PO SOLN: 5.0000 mg | Freq: Once | ORAL | Status: DC | PRN

## 2021-10-26 MED ORDER — ACETAMINOPHEN 160 MG/5ML PO SOLN: 325.0000 mg | ORAL | Status: DC | PRN

## 2021-10-26 MED ORDER — DEXAMETHASONE SODIUM PHOSPHATE 10 MG/ML IJ SOLN
INTRAMUSCULAR | Status: AC
  Filled 2021-10-26: qty 1

## 2021-10-26 MED ORDER — OXYMETAZOLINE HCL 0.05 % NA SOLN
NASAL | Status: DC | PRN
  Administered 2021-10-26: 1

## 2021-10-26 MED ORDER — MIDAZOLAM HCL 2 MG/2ML IJ SOLN
INTRAMUSCULAR | Status: DC | PRN
  Administered 2021-10-26: 1 mg via INTRAVENOUS

## 2021-10-26 MED ORDER — ONDANSETRON HCL 4 MG/2ML IJ SOLN
INTRAMUSCULAR | Status: AC
  Filled 2021-10-26: qty 2

## 2021-10-26 MED ORDER — SUCCINYLCHOLINE CHLORIDE 200 MG/10ML IV SOSY
PREFILLED_SYRINGE | INTRAVENOUS | Status: AC
  Filled 2021-10-26: qty 10

## 2021-10-26 MED ORDER — PHENYLEPHRINE HCL-NACL 20-0.9 MG/250ML-% IV SOLN
INTRAVENOUS | Status: DC | PRN
  Administered 2021-10-26: 50 ug/min via INTRAVENOUS

## 2021-10-26 MED ORDER — LACTATED RINGERS IV SOLN: INTRAVENOUS | Status: DC

## 2021-10-26 MED ORDER — PHENYLEPHRINE 40 MCG/ML (10ML) SYRINGE FOR IV PUSH (FOR BLOOD PRESSURE SUPPORT)
PREFILLED_SYRINGE | INTRAVENOUS | Status: AC
  Filled 2021-10-26: qty 10

## 2021-10-26 MED ORDER — ACETAMINOPHEN 325 MG PO TABS: 325.0000 mg | ORAL_TABLET | ORAL | Status: DC | PRN

## 2021-10-26 MED ORDER — PROPOFOL 10 MG/ML IV BOLUS
INTRAVENOUS | Status: DC | PRN
  Administered 2021-10-26 (×2): 50 mg via INTRAVENOUS
  Administered 2021-10-26: 200 mg via INTRAVENOUS

## 2021-10-26 MED ORDER — OXYMETAZOLINE HCL 0.05 % NA SOLN
NASAL | Status: AC
  Filled 2021-10-26: qty 30

## 2021-10-26 MED ORDER — ONDANSETRON HCL 4 MG/2ML IJ SOLN: 4.0000 mg | Freq: Once | INTRAMUSCULAR | Status: DC | PRN

## 2021-10-26 SURGICAL SUPPLY — 39 items
BAG COUNTER SPONGE SURGICOUNT (BAG) IMPLANT
BAG SPNG CNTER NS LX DISP (BAG)
BLADE SURG 15 STRL LF DISP TIS (BLADE) ×1 IMPLANT
BLADE SURG 15 STRL SS (BLADE) ×2
BUR CROSS CUT FISSURE 1.6 (BURR) ×2 IMPLANT
CANISTER SUCT 3000ML PPV (MISCELLANEOUS) ×2 IMPLANT
COVER SURGICAL LIGHT HANDLE (MISCELLANEOUS) ×1 IMPLANT
DECANTER SPIKE VIAL GLASS SM (MISCELLANEOUS) ×1 IMPLANT
DRAPE U-SHAPE 76X120 STRL (DRAPES) ×2 IMPLANT
GAUZE PACKING FOLDED 2  STR (GAUZE/BANDAGES/DRESSINGS) ×2
GAUZE PACKING FOLDED 2 STR (GAUZE/BANDAGES/DRESSINGS) ×1 IMPLANT
GAUZE SPONGE 4X4 12PLY STRL LF (GAUZE/BANDAGES/DRESSINGS) ×2 IMPLANT
GLOVE SRG 8 PF TXTR STRL LF DI (GLOVE) IMPLANT
GLOVE SURG ENC MOIS LTX SZ8 (GLOVE) ×2 IMPLANT
GLOVE SURG UNDER LTX SZ7 (GLOVE) ×1 IMPLANT
GLOVE SURG UNDER POLY LF SZ8 (GLOVE)
GOWN STRL REUS W/ TWL LRG LVL3 (GOWN DISPOSABLE) ×1 IMPLANT
GOWN STRL REUS W/ TWL XL LVL3 (GOWN DISPOSABLE) ×1 IMPLANT
GOWN STRL REUS W/TWL LRG LVL3 (GOWN DISPOSABLE) ×4
GOWN STRL REUS W/TWL XL LVL3 (GOWN DISPOSABLE) ×2
IV NS 1000ML (IV SOLUTION) ×2
IV NS 1000ML BAXH (IV SOLUTION) ×1 IMPLANT
KIT BASIN OR (CUSTOM PROCEDURE TRAY) ×2 IMPLANT
KIT TURNOVER KIT B (KITS) ×2 IMPLANT
NDL HYPO 25GX1X1/2 BEV (NEEDLE) ×2 IMPLANT
NEEDLE HYPO 25GX1X1/2 BEV (NEEDLE) ×4 IMPLANT
NS IRRIG 1000ML POUR BTL (IV SOLUTION) ×2 IMPLANT
PAD ARMBOARD 7.5X6 YLW CONV (MISCELLANEOUS) ×4 IMPLANT
SCREW BONE MANDIB SD 2X9 (Screw) ×3 IMPLANT
SLEEVE IRRIGATION ELITE 7 (MISCELLANEOUS) ×2 IMPLANT
SPONGE SURGIFOAM ABS GEL 12-7 (HEMOSTASIS) IMPLANT
SUT CHROMIC 3 0 PS 2 (SUTURE) ×3 IMPLANT
SYR BULB IRRIG 60ML STRL (SYRINGE) ×2 IMPLANT
SYR CONTROL 10ML LL (SYRINGE) ×2 IMPLANT
TRAY ENT MC OR (CUSTOM PROCEDURE TRAY) ×2 IMPLANT
TUBE SALEM SUMP 16 FR W/ARV (TUBING) ×2 IMPLANT
TUBING IRRIGATION (MISCELLANEOUS) ×2 IMPLANT
WIRE 24 GAUGE OMINIMAX MMF (WIRE) ×1 IMPLANT
YANKAUER SUCT BULB TIP NO VENT (SUCTIONS) ×2 IMPLANT

## 2021-10-26 NOTE — Progress Notes (Signed)
Throat pack out at 1408 ? ?

## 2021-10-26 NOTE — Transfer of Care (Signed)
Immediate Anesthesia Transfer of Care Note ? ?Patient: Isabella Powell ? ?Procedure(s) Performed: DENTAL EXTRACTIONS, MAXILLO MANDIBULAR FIXATION (Mouth) ? ?Patient Location: PACU ? ?Anesthesia Type:General ? ?Level of Consciousness: drowsy ? ?Airway & Oxygen Therapy: Patient Spontanous Breathing and Patient connected to face mask oxygen ? ?Post-op Assessment: Report given to RN and Post -op Vital signs reviewed and stable ? ?Post vital signs: Reviewed and stable ? ?Last Vitals:  ?Vitals Value Taken Time  ?BP 130/90 10/26/21 1447  ?Temp    ?Pulse 82 10/26/21 1449  ?Resp 15 10/26/21 1449  ?SpO2 100 % 10/26/21 1449  ?Vitals shown include unvalidated device data. ? ?Last Pain:  ?Vitals:  ? 10/26/21 1253  ?TempSrc:   ?PainSc: 10-Worst pain ever  ?   ? ?Patients Stated Pain Goal: 4 (10/26/21 1253) ? ?Complications: No notable events documented. ?

## 2021-10-26 NOTE — Progress Notes (Signed)
Mobility Specialist: Progress Note ? ? 10/26/21 1600  ?Mobility  ?Activity Ambulated with assistance to bathroom  ?Level of Assistance Minimal assist, patient does 75% or more  ?Assistive Device Other (Comment) ?(HHA)  ?Distance Ambulated (ft) 24 ft  ?Activity Response Tolerated well  ?$Mobility charge 1 Mobility  ? ?Pt received in bed, refused hallway ambulation but requesting to use BR. Pt required minA to sit EOB and contact guard to BR. C/o pain throughout, no rating given. Pt back to bed after BR with call bell at her side and staff present in the room.  ? ?Cristal Deer Leighann Amadon ?Mobility Specialist ?Mobility Specialist 5 [address]: [phone] ?Mobility Specialist 6 [address]: [phone] ? ?

## 2021-10-26 NOTE — Consult Note (Signed)
Reason for Consult:Mandible fracture ?Referring Physician: Dr. Marjie Skiff ? ?Isabella Powell is an 51 y.o. female.  ?HPI: 51yo F who initially presented to the ER with a mandibular fracture and was treated by Dr. Marjie Skiff. I was consulted to assist in managing the dental aspect of the mandibular fracture.  ? ?Past Medical History:  ?Diagnosis Date  ? Bartholin cyst   ? Hepatitis   ? Hepatitis C   ? Psoriasis-like skin disease   ? Renal disorder   ? Trichomonas   ? patient states that many years ago  ? ? ?Past Surgical History:  ?Procedure Laterality Date  ? CESAREAN SECTION    ? ORIF MANDIBULAR FRACTURE N/A 10/24/2021  ? Procedure: OPEN REDUCTION INTERNAL FIXATION (ORIF) MANDIBULAR FRACTURE;  Surgeon: Suzanna Obey, MD;  [facility];  Service: ENT;  Laterality: N/A;  ? TUBAL LIGATION    ? bilateral  ? ? ?Family History  ?Problem Relation Age of Onset  ? Cancer Father   ? Emphysema Father   ? ? ?Social History:  reports that she has been smoking cigarettes. She has been smoking an average of .5 packs per day. She has never used smokeless tobacco. She reports current alcohol use. She reports current drug use. Drug: Marijuana. ? ?Allergies: No Known Allergies ? ?Medications: I have reviewed the patient's current medications. ? ?No results found for this or any previous visit (from the past 48 hour(s)). ? ?DG Orthopantogram ? ?Result Date: 10/25/2021 ?CLINICAL DATA:  Mandibular fracture. EXAM: ORTHOPANTOGRAM/PANORAMIC COMPARISON:  CT of October 24, 2021. FINDINGS: Status post surgical internal fixation of right mandibular fracture. Continued presence of nondisplaced left mandibular fracture. The mandible and maxilla have been surgically fixated. IMPRESSION: Status post surgical internal fixation of right mandibular fracture. Continued presence of nondisplaced left mandibular fracture. Electronically Signed   By: Lupita Raider M.D.   On: 10/25/2021 10:28   ? ?Review of Systems ?Blood pressure 116/77, pulse 87, temperature 97.9  ?F (36.6 ?C), resp. rate 20, height 5\' 10"  (1.778 m), weight 53.1 kg, SpO2 95 %. ?Physical Exam ? ?Assessment/Plan: ?This is a 51yo F now s/p ORIF right mandibula fracture and CRMMF left mandibular fracture. I plan to take the patient to the operating room with Dr. 51yo to remove indicated teeth and assist in replacing the arch bars and possible ORIF left angle fracture.  ? ?Marjie Skiff ?10/26/2021, 10:25 AM  ? ? ? ? ?

## 2021-10-26 NOTE — Progress Notes (Signed)
Patient is asking for food and Dr. Jearld Fenton said she can start at full liquid and advance to soft if tolerable. She also wanted throat spray and he said that's ok but will be hard to get into back of throat.  ?

## 2021-10-26 NOTE — Anesthesia Preprocedure Evaluation (Signed)
Anesthesia Evaluation  ?Patient identified by MRN, date of birth, ID band ?Patient awake ? ? ? ?Reviewed: ?Allergy & Precautions, NPO status , Patient's Chart, lab work & pertinent test results ? ?Airway ?Mallampati: I ? ? ? ? ? ? Dental ? ?(+) Poor Dentition, Missing, Loose ?  ?Pulmonary ?Current Smoker and Patient abstained from smoking.,  ?  ?Pulmonary exam normal ? ? ? ? ? ? ? Cardiovascular ?negative cardio ROS ?Normal cardiovascular exam ? ? ?  ?Neuro/Psych ?negative neurological ROS ? negative psych ROS  ? GI/Hepatic ?negative GI ROS, (+) Hepatitis -, C  ?Endo/Other  ? ? Renal/GU ?  ?negative genitourinary ?  ?Musculoskeletal ? ? Abdominal ?Normal abdominal exam  (+)   ?Peds ? Hematology ?  ?Anesthesia Other Findings ? ? Reproductive/Obstetrics ? ?  ? ? ? ? ? ? ? ? ? ? ? ? ? ?  ?  ? ? ? ? ? ? ? ? ?Anesthesia Physical ?Anesthesia Plan ? ?ASA: 2 ? ?Anesthesia Plan: General  ? ?Post-op Pain Management: Dilaudid IV  ? ?Induction: Intravenous ? ?PONV Risk Score and Plan: 3 and Ondansetron, Dexamethasone and Midazolam ? ?Airway Management Planned: Nasal ETT ? ?Additional Equipment: None ? ?Intra-op Plan:  ? ?Post-operative Plan: Extubation in OR ? ?Informed Consent: I have reviewed the patients History and Physical, chart, labs and discussed the procedure including the risks, benefits and alternatives for the proposed anesthesia with the patient or authorized representative who has indicated his/her understanding and acceptance.  ? ? ? ?Dental advisory given ? ?Plan Discussed with: CRNA ? ?Anesthesia Plan Comments:   ? ? ? ? ? ? ?Anesthesia Quick Evaluation ? ?

## 2021-10-26 NOTE — Anesthesia Procedure Notes (Signed)
Procedure Name: Intubation ?Date/Time: 10/26/2021 1:26 PM ?Performed by: Carolan Clines, CRNA ?Pre-anesthesia Checklist: Patient identified, Emergency Drugs available, Suction available and Patient being monitored ?Patient Re-evaluated:Patient Re-evaluated prior to induction ?Oxygen Delivery Method: Circle System Utilized ?Preoxygenation: Pre-oxygenation with 100% oxygen ?Induction Type: IV induction ?Laryngoscope Size: Glidescope and 3 ?Grade View: Grade I ?Tube type: Oral ?Tube size: 7.0 mm ?Number of attempts: 2 ?Airway Equipment and Method: Video-laryngoscopy and Rigid stylet ?Placement Confirmation: ETT inserted through vocal cords under direct vision, positive ETCO2 and breath sounds checked- equal and bilateral ?Tube secured with: Tape ?Dental Injury: Teeth and Oropharynx as per pre-operative assessment  ?Difficulty Due To: Difficulty was anticipated ?Future Recommendations: Recommend- induction with short-acting agent, and alternative techniques readily available ?Comments: Oral wires cut just prior to induction. 7.0 nasal ETT passed through right nare without difficulty. Grade I view with glidescope. Unable to locate and advance nasal ETT through cords due to oropharyngeal swelling and bleeding. Able to pass 7.0 oral ETT without difficulty. ? ? ? ? ?

## 2021-10-26 NOTE — Progress Notes (Signed)
Throat pack placed at 1343

## 2021-10-27 ENCOUNTER — Encounter (HOSPITAL_COMMUNITY): Payer: Self-pay | Admitting: Oral Surgery

## 2021-10-27 ENCOUNTER — Telehealth (INDEPENDENT_AMBULATORY_CARE_PROVIDER_SITE_OTHER): Payer: Self-pay | Admitting: Otolaryngology

## 2021-10-27 ENCOUNTER — Other Ambulatory Visit (HOSPITAL_COMMUNITY): Payer: Self-pay

## 2021-10-27 LAB — HIV ANTIBODY (ROUTINE TESTING W REFLEX): HIV Screen 4th Generation wRfx: NONREACTIVE

## 2021-10-27 MED ORDER — HYDROCODONE-ACETAMINOPHEN 7.5-325 MG/15ML PO SOLN
15.0000 mL | Freq: Four times a day (QID) | ORAL | 0 refills | Status: DC | PRN
  Filled 2021-10-27: qty 120, 2d supply, fill #0

## 2021-10-27 MED ORDER — HYDROCODONE-ACETAMINOPHEN 7.5-325 MG/15ML PO SOLN: 15.0000 mL | Freq: Four times a day (QID) | ORAL | 0 refills | Status: DC | PRN

## 2021-10-27 MED ORDER — CEPHALEXIN 250 MG/5ML PO SUSR
500.0000 mg | Freq: Three times a day (TID) | ORAL | 0 refills | Status: DC
Start: 2021-10-27 — End: 2021-11-03

## 2021-10-27 MED ORDER — OXYMETAZOLINE HCL 0.05 % NA SOLN
1.0000 | Freq: Two times a day (BID) | NASAL | Status: DC
  Filled 2021-10-27: qty 30

## 2021-10-27 NOTE — Progress Notes (Signed)
Patient would like to talk with social worker about her living situation and she needs a ride to daughters house today at discharge.  ?
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
## 2021-10-27 NOTE — Progress Notes (Signed)
Mobility Specialist: Progress Note ? ? 10/27/21 1109  ?Mobility  ?Activity Ambulated independently in hallway  ?Level of Assistance Independent  ?Assistive Device None  ?Distance Ambulated (ft) 500 ft  ?Activity Response Tolerated well  ?$Mobility charge 1 Mobility  ? ?Pt received in bed and agreeable to ambulation. C/o 8/10 pain in her face/mouth, otherwise asymptomatic. Pt back to bed after session with call bell and phone at her side.  ? ?Cristal Deer Chanceler Pullin ?Mobility Specialist ?Mobility Specialist 5 North: [phone] ?Mobility Specialist 6 North: [phone] ? ?

## 2021-10-27 NOTE — Discharge Summary (Signed)
Physician Discharge Summary  ?Patient ID: ?Isabella Powell ?MRN: [id number] ?DOB/AGE: April 07, 1971 51 y.o. ? ?Admit date: 10/24/2021 ?Discharge date: 10/27/2021 ? ?Admission Diagnoses: Mandible fracture and  ? ?Discharge Diagnoses: same ?Principal Problem: ?  Mandible open fracture (HCC) ? ? ?Discharged Condition: good ? ?Hospital Course: Patient was admitted for management of a mandible fracture.  She had open reduction internal fixation of mandible fracture but had very poor dentition.  The following day oral surgery was consulted for management of the dentition.  She was taken back to the operating room for extraction of the teeth.  She now is back in MMF and doing well.  She is able to talk.  The swelling is decreased.  Her pain is managed.  She is taking full liquid and soft diet.  She is discharged to home. ? ?Consults: None ? ?Significant Diagnostic Studies:  ? ?Treatments: Open reduction internal fixation of mandible fracture and maxillary mandibular fixation.  Dental extraction ? ?Discharge Exam: ?Blood pressure 122/88, pulse 88, temperature 99 ?F (37.2 ?C), temperature source Oral, resp. rate 20, height 5' 7.5" (1.715 m), weight 54 kg, last menstrual period 12/22/2015, SpO2 99 %. ?Awake and alert.  She is much better today.  She has wires that are secure and occlusion looks excellent.  Heart is regular.  Extremities no swelling or tenderness.  Lungs are clear. ? ?Disposition:  ? ? ? ? ? ?Signed: ?Suzanna Obey ?10/27/2021, 10:00 AM ? ? ?

## 2021-10-27 NOTE — Plan of Care (Signed)
Patient going home with daughter ?

## 2021-10-27 NOTE — Telephone Encounter (Signed)
I called the [facility] and increased the cephalexin to 400 cc so 14 days of antibiotics ?
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
## 2021-10-28 NOTE — Anesthesia Postprocedure Evaluation (Signed)
Anesthesia Post Note ? ?Patient: Isabella Powell ? ?Procedure(s) Performed: DENTAL EXTRACTIONS, MAXILLO MANDIBULAR FIXATION (Mouth) ? ?  ? ?Patient location during evaluation: PACU ?Anesthesia Type: General ?Level of consciousness: awake ?Pain management: pain level controlled ?Vital Signs Assessment: post-procedure vital signs reviewed and stable ?Respiratory status: spontaneous breathing ?Cardiovascular status: stable ?Postop Assessment: no apparent nausea or vomiting ?Anesthetic complications: no ? ? ?No notable events documented. ? ?Last Vitals:  ?Vitals:  ? 10/27/21 0800 10/27/21 1032  ?BP: 122/88 140/85  ?Pulse: 88 75  ?Resp: 20 16  ?Temp: 37.2 ?C 37.2 ?C  ?SpO2: 99% 97%  ?  ?Last Pain:  ?Vitals:  ? 10/27/21 1032  ?TempSrc: Axillary  ?PainSc:   ? ? ?  ?  ?  ?  ?  ?  ? ?Huston Foley ? ? ? ? ?

## 2021-11-03 ENCOUNTER — Other Ambulatory Visit: Payer: Self-pay

## 2021-11-03 ENCOUNTER — Emergency Department (HOSPITAL_COMMUNITY): Payer: Medicaid Other

## 2021-11-03 ENCOUNTER — Encounter (HOSPITAL_COMMUNITY): Payer: Self-pay | Admitting: Emergency Medicine

## 2021-11-03 ENCOUNTER — Emergency Department (HOSPITAL_COMMUNITY)
Admission: EM | Admit: 2021-11-03 | Discharge: 2021-11-03 | Disposition: A | Discharge status: Home / Self Care | Payer: Medicaid Other | Attending: Emergency Medicine | Admitting: Emergency Medicine

## 2021-11-03 DIAGNOSIS — S0993XA Unspecified injury of face, initial encounter: Secondary | ICD-10-CM | POA: Diagnosis present

## 2021-11-03 DIAGNOSIS — S0281XA Fracture of other specified skull and facial bones, right side, initial encounter for closed fracture: Secondary | ICD-10-CM | POA: Insufficient documentation

## 2021-11-03 DIAGNOSIS — S02609S Fracture of mandible, unspecified, sequela: Secondary | ICD-10-CM

## 2021-11-03 DIAGNOSIS — D72819 Decreased white blood cell count, unspecified: Secondary | ICD-10-CM | POA: Diagnosis not present

## 2021-11-03 DIAGNOSIS — S02601A Fracture of unspecified part of body of right mandible, initial encounter for closed fracture: Secondary | ICD-10-CM | POA: Insufficient documentation

## 2021-11-03 DIAGNOSIS — S02602A Fracture of unspecified part of body of left mandible, initial encounter for closed fracture: Secondary | ICD-10-CM | POA: Insufficient documentation

## 2021-11-03 DIAGNOSIS — R7401 Elevation of levels of liver transaminase levels: Secondary | ICD-10-CM | POA: Insufficient documentation

## 2021-11-03 DIAGNOSIS — S02600D Fracture of unspecified part of body of mandible, subsequent encounter for fracture with routine healing: Secondary | ICD-10-CM | POA: Insufficient documentation

## 2021-11-03 DIAGNOSIS — S0269XA Fracture of mandible of other specified site, initial encounter for closed fracture: Secondary | ICD-10-CM

## 2021-11-03 DIAGNOSIS — R519 Headache, unspecified: Secondary | ICD-10-CM | POA: Insufficient documentation

## 2021-11-03 LAB — COMPREHENSIVE METABOLIC PANEL
ALT: 110 U/L — ABNORMAL HIGH (ref 0–44)
AST: 213 U/L — ABNORMAL HIGH (ref 15–41)
Albumin: 3 g/dL — ABNORMAL LOW (ref 3.5–5.0)
Alkaline Phosphatase: 98 U/L (ref 38–126)
Anion gap: 6 (ref 5–15)
BUN: 5 mg/dL — ABNORMAL LOW (ref 6–20)
CO2: 25 mmol/L (ref 22–32)
Calcium: 8.5 mg/dL — ABNORMAL LOW (ref 8.9–10.3)
Chloride: 103 mmol/L (ref 98–111)
Creatinine, Ser: 0.65 mg/dL (ref 0.44–1.00)
GFR, Estimated: >60 mL/min (ref >60)
Glucose, Bld: 101 mg/dL — ABNORMAL HIGH (ref 70–99)
Potassium: 4.1 mmol/L (ref 3.5–5.1)
Sodium: 134 mmol/L — ABNORMAL LOW (ref 135–145)
Total Bilirubin: 1.6 mg/dL — ABNORMAL HIGH (ref 0.3–1.2)
Total Protein: 8.3 g/dL — ABNORMAL HIGH (ref 6.5–8.1)

## 2021-11-03 LAB — CBC WITH DIFFERENTIAL/PLATELET
Abs Immature Granulocytes: 0.01 10*3/uL (ref 0.00–0.07)
Basophils Absolute: 0 10*3/uL (ref 0.0–0.1)
Basophils Relative: 1 %
Eosinophils Absolute: 0.1 10*3/uL (ref 0.0–0.5)
Eosinophils Relative: 5 %
HCT: 33 % — ABNORMAL LOW (ref 36.0–46.0)
Hemoglobin: 10.8 g/dL — ABNORMAL LOW (ref 12.0–15.0)
Immature Granulocytes: 1 %
Lymphocytes Relative: 33 %
Lymphs Abs: 0.6 10*3/uL — ABNORMAL LOW (ref 0.7–4.0)
MCH: 34.8 pg — ABNORMAL HIGH (ref 26.0–34.0)
MCHC: 32.7 g/dL (ref 30.0–36.0)
MCV: 106.5 fL — ABNORMAL HIGH (ref 80.0–100.0)
Monocytes Absolute: 0.4 10*3/uL (ref 0.1–1.0)
Monocytes Relative: 19 %
Neutro Abs: 0.8 10*3/uL — ABNORMAL LOW (ref 1.7–7.7)
Neutrophils Relative %: 41 %
Platelets: 273 10*3/uL (ref 150–400)
RBC: 3.1 MIL/uL — ABNORMAL LOW (ref 3.87–5.11)
RDW: 13.4 % (ref 11.5–15.5)
WBC: 1.9 10*3/uL — ABNORMAL LOW (ref 4.0–10.5)
nRBC: 0 % (ref 0.0–0.2)

## 2021-11-03 MED ORDER — MORPHINE SULFATE (PF) 4 MG/ML IV SOLN
4.0000 mg | Freq: Once | INTRAVENOUS | Status: AC
  Administered 2021-11-03: 4 mg via INTRAVENOUS
  Filled 2021-11-03: qty 1

## 2021-11-03 MED ORDER — ONDANSETRON 4 MG PO TBDP
4.0000 mg | ORAL_TABLET | Freq: Once | ORAL | Status: AC
  Administered 2021-11-03: 4 mg via ORAL
  Filled 2021-11-03: qty 1

## 2021-11-03 MED ORDER — AMOXICILLIN-POT CLAVULANATE 600-42.9 MG/5ML PO SUSR: 875.0000 mg | Freq: Two times a day (BID) | ORAL | 0 refills | Status: AC

## 2021-11-03 MED ORDER — IBUPROFEN 100 MG/5ML PO SUSP
600.0000 mg | Freq: Once | ORAL | Status: AC
  Administered 2021-11-03: 600 mg via ORAL
  Filled 2021-11-03: qty 30

## 2021-11-03 MED ORDER — HYDROCODONE-ACETAMINOPHEN 7.5-325 MG/15ML PO SOLN: 15.0000 mL | Freq: Four times a day (QID) | ORAL | 0 refills | Status: AC | PRN

## 2021-11-03 NOTE — ED Notes (Signed)
Social Work called to provide the patient with a bus pass ?
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
## 2021-11-03 NOTE — ED Notes (Signed)
Disregard EKG done at 11/03/2021 1011. Wrong pt.  ?

## 2021-11-03 NOTE — Discharge Instructions (Addendum)
You came to the emergency department today to be evaluated for your pain and swelling.  The CT scan obtained shows that your left mandibular angle fracture is not surgically fixed and is increasingly comminuted/displaced.  You will need to follow-up with the ENT provider listed on this paperwork.  Please call their office tomorrow morning for a follow-up appointment. ? ?I have made a change in your antibiotics.  Please discontinue taking Keflex and start taking the Augmentin you were prescribed.  Additionally I have given you further prescription for pain medication.  Please take this medication as prescribed. ? ?Your blood work showed that your white blood cell count was decreased.  Please follow-up with the hematologist listed on this paperwork for follow-up. ? ?Today you received medications that may make you sleepy or impair your ability to make decisions.  For the next 24 hours please do not drive, operate heavy machinery, care for a small child with out another adult present, or perform any activities that may cause harm to you or someone else if you were to fall asleep or be impaired.  ? ?Get help right away if: ?You have difficulty breathing. ?You feel like your windpipe (trachea) is tightening. ?You cannot swallow your saliva. ?You make whistling sounds when you breathe (wheeze). ?

## 2021-11-03 NOTE — ED Notes (Signed)
After reviewing d/c instructions with the patient she reported feeling nauseous and began spitting up like she was vomiting. PA informed and will order ODT zofran. ?
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
## 2021-11-03 NOTE — ED Notes (Signed)
Pt A&OX4 ambulatory at d/c with independent steady gait, NAD. Pt verbalized understanding of d/c instructions, prescriptions and follow up care. Pt was provided a phone and has called a friend for a ride home.  ?

## 2021-11-04 LAB — PATHOLOGIST SMEAR REVIEW

## 2021-11-08 ENCOUNTER — Ambulatory Visit (INDEPENDENT_AMBULATORY_CARE_PROVIDER_SITE_OTHER): Payer: Medicaid Other | Admitting: Otolaryngology

## 2021-11-10 ENCOUNTER — Encounter (HOSPITAL_COMMUNITY): Payer: Self-pay | Admitting: *Deleted

## 2021-11-10 ENCOUNTER — Encounter (HOSPITAL_COMMUNITY): Payer: Self-pay | Admitting: Vascular Surgery
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
## 2021-11-10 NOTE — Anesthesia Preprocedure Evaluation (Deleted)
Anesthesia Evaluation  ? ? ?Airway ? ? ? ? ? ? ? Dental ?  ?Pulmonary ?Current Smoker and Patient abstained from smoking.,  ?  ? ? ? ? ? ? ? Cardiovascular ? ? ? ?  ?Neuro/Psych ?  ? GI/Hepatic ?  ?Endo/Other  ? ? Renal/GU ?  ? ?  ?Musculoskeletal ? ? Abdominal ?  ?Peds ? Hematology ?  ?Anesthesia Other Findings ? ? Reproductive/Obstetrics ? ?  ? ? ? ? ? ? ? ? ? ? ? ? ? ?  ?  ? ? ? ? ? ? ? ? ?Anesthesia Physical ?Anesthesia Plan ? ?ASA:  ? ?Anesthesia Plan:   ? ?Post-op Pain Management:   ? ?Induction:  ? ?PONV Risk Score and Plan:  ? ?Airway Management Planned:  ? ?Additional Equipment:  ? ?Intra-op Plan:  ? ?Post-operative Plan:  ? ?Informed Consent:  ? ?Plan Discussed with:  ? ?Anesthesia Plan Comments: (PAT note written 11/10/2021 by Shonna Chock, PA-C. ?)  ? ? ? ? ? ? ?Anesthesia Quick Evaluation ? ?

## 2021-11-10 NOTE — Progress Notes (Signed)
Left a detailed message on machine with instructions for DOS. ? ?TWO VISITORS ARE ALLOWED TO COME WITH YOU AND STAY IN THE SURGICAL WAITING ROOM ONLY DURING PRE OP AND PROCEDURE DAY OF SURGERY.  ? ?PCP - None ?Cardiologist - n/a ? ?Chest x-ray - n/a ?EKG - 11/03/21 ?Stress Test - n/a ?ECHO - n/a ?Cardiac Cath - n/a ? ?ICD Pacemaker/Loop - n/a ? ?Sleep Study -  n/a ?CPAP - none ? ?Anesthesia review: Yes ? ?STOP now taking any Aspirin (unless otherwise instructed by your surgeon), Aleve, Naproxen, Ibuprofen, Motrin, Advil, Goody's, BC's, all herbal medications, fish oil, and all vitamins.  ? ?

## 2021-11-11 ENCOUNTER — Ambulatory Visit (HOSPITAL_COMMUNITY): Admission: RE | Admit: 2021-11-11 | Payer: Medicaid Other | Source: Home / Self Care

## 2021-11-11 ENCOUNTER — Encounter (HOSPITAL_COMMUNITY): Payer: Self-pay | Admitting: Certified Registered"

## 2021-11-11 SURGERY — OPEN REDUCTION INTERNAL FIXATION (ORIF) MANDIBULAR FRACTURE
Anesthesia: General

## 2021-11-11 MED ORDER — FENTANYL CITRATE (PF) 250 MCG/5ML IJ SOLN
INTRAMUSCULAR | Status: AC
  Filled 2021-11-11: qty 5

## 2021-11-11 MED ORDER — MIDAZOLAM HCL 2 MG/2ML IJ SOLN
INTRAMUSCULAR | Status: AC
  Filled 2021-11-11: qty 2

## 2021-11-11 MED ORDER — PROPOFOL 10 MG/ML IV BOLUS
INTRAVENOUS | Status: AC
  Filled 2021-11-11: qty 20

## 2021-11-12 ENCOUNTER — Telehealth: Payer: Self-pay

## 2021-11-12 NOTE — Telephone Encounter (Signed)
Called patient, LMVM surgery has been re-scheduled for next Weds 5/3 with a start time 2PM. Arrive at [facility] at 12/noon. NPO for minimum of 9 hours. ?

## 2021-11-12 NOTE — Telephone Encounter (Signed)
Patient called to follow up; patient aware of new surgery date, arrival time, and instructions.  ?

## 2021-11-15 ENCOUNTER — Telehealth: Payer: Self-pay

## 2021-11-15 NOTE — Telephone Encounter (Signed)
Filed filled out review status and faxed to [place] to Case management ?

## 2021-11-16 ENCOUNTER — Telehealth: Payer: Self-pay

## 2021-11-16 NOTE — Telephone Encounter (Signed)
error 

## 2021-11-16 NOTE — Progress Notes (Signed)
Attempted multiple times to contact pt to give pre-op instructions, pt cannot be reached. All calls go straight to VM, left VM instructions to arrive at 1130 on 5/3. NPO after midnight, no meds day of surgery.  ?

## 2021-11-17 ENCOUNTER — Encounter (HOSPITAL_COMMUNITY): Payer: Self-pay

## 2021-11-17 ENCOUNTER — Other Ambulatory Visit: Payer: Self-pay

## 2021-11-17 ENCOUNTER — Ambulatory Visit (HOSPITAL_COMMUNITY): Payer: Medicaid Other | Admitting: Anesthesiology

## 2021-11-17 ENCOUNTER — Ambulatory Visit (HOSPITAL_COMMUNITY)
Admission: RE | Admit: 2021-11-17 | Discharge: 2021-11-17 | Disposition: A | Discharge status: Home / Self Care | Payer: Medicaid Other | Attending: Otolaryngology | Admitting: Otolaryngology

## 2021-11-17 ENCOUNTER — Encounter (HOSPITAL_COMMUNITY)
Admission: RE | Disposition: A | Discharge status: Home / Self Care | Payer: Self-pay | Source: Home / Self Care | Attending: Otolaryngology

## 2021-11-17 ENCOUNTER — Ambulatory Visit (HOSPITAL_BASED_OUTPATIENT_CLINIC_OR_DEPARTMENT_OTHER): Payer: Medicaid Other | Admitting: Anesthesiology

## 2021-11-17 DIAGNOSIS — Y838 Other surgical procedures as the cause of abnormal reaction of the patient, or of later complication, without mention of misadventure at the time of the procedure: Secondary | ICD-10-CM | POA: Diagnosis not present

## 2021-11-17 DIAGNOSIS — M272 Inflammatory conditions of jaws: Secondary | ICD-10-CM | POA: Diagnosis not present

## 2021-11-17 DIAGNOSIS — T84228A Displacement of internal fixation device of other bones, initial encounter: Secondary | ICD-10-CM | POA: Insufficient documentation

## 2021-11-17 DIAGNOSIS — S02609A Fracture of mandible, unspecified, initial encounter for closed fracture: Secondary | ICD-10-CM | POA: Diagnosis not present

## 2021-11-17 DIAGNOSIS — S02652A Fracture of angle of left mandible, initial encounter for closed fracture: Secondary | ICD-10-CM | POA: Insufficient documentation

## 2021-11-17 DIAGNOSIS — F17210 Nicotine dependence, cigarettes, uncomplicated: Secondary | ICD-10-CM | POA: Insufficient documentation

## 2021-11-17 HISTORY — PX: ORIF MANDIBULAR FRACTURE: SHX2127

## 2021-11-17 SURGERY — OPEN REDUCTION INTERNAL FIXATION (ORIF) MANDIBULAR FRACTURE
Anesthesia: General | Site: Mouth

## 2021-11-17 MED ORDER — ORAL CARE MOUTH RINSE: 15.0000 mL | Freq: Once | OROMUCOSAL | Status: DC

## 2021-11-17 MED ORDER — HYDROCODONE-ACETAMINOPHEN 7.5-325 MG PO TABS
1.0000 | ORAL_TABLET | Freq: Four times a day (QID) | ORAL | 0 refills | Status: DC | PRN
Start: 2021-11-17 — End: 2022-02-28

## 2021-11-17 MED ORDER — KETOROLAC TROMETHAMINE 15 MG/ML IJ SOLN
INTRAMUSCULAR | Status: DC | PRN
  Administered 2021-11-17: 15 mg via INTRAVENOUS

## 2021-11-17 MED ORDER — MIDAZOLAM HCL 2 MG/2ML IJ SOLN
INTRAMUSCULAR | Status: DC | PRN
  Administered 2021-11-17: 2 mg via INTRAVENOUS

## 2021-11-17 MED ORDER — ONDANSETRON HCL 4 MG/2ML IJ SOLN
INTRAMUSCULAR | Status: DC | PRN
  Administered 2021-11-17: 4 mg via INTRAVENOUS

## 2021-11-17 MED ORDER — FENTANYL CITRATE (PF) 250 MCG/5ML IJ SOLN
INTRAMUSCULAR | Status: DC | PRN
  Administered 2021-11-17 (×3): 50 ug via INTRAVENOUS

## 2021-11-17 MED ORDER — ACETAMINOPHEN 160 MG/5ML PO SOLN: 325.0000 mg | Freq: Once | ORAL | Status: DC | PRN

## 2021-11-17 MED ORDER — AMISULPRIDE (ANTIEMETIC) 5 MG/2ML IV SOLN: 10.0000 mg | Freq: Once | INTRAVENOUS | Status: DC | PRN

## 2021-11-17 MED ORDER — OXYMETAZOLINE HCL 0.05 % NA SOLN
NASAL | Status: AC
  Filled 2021-11-17: qty 30

## 2021-11-17 MED ORDER — ORAL CARE MOUTH RINSE: 15.0000 mL | Freq: Once | OROMUCOSAL | Status: AC

## 2021-11-17 MED ORDER — PROPOFOL 10 MG/ML IV BOLUS
INTRAVENOUS | Status: AC
  Filled 2021-11-17: qty 20

## 2021-11-17 MED ORDER — FENTANYL CITRATE (PF) 100 MCG/2ML IJ SOLN
25.0000 ug | Freq: Once | INTRAMUSCULAR | Status: AC
  Administered 2021-11-17: 25 ug via INTRAVENOUS

## 2021-11-17 MED ORDER — LIDOCAINE HCL (CARDIAC) PF 100 MG/5ML IV SOSY
PREFILLED_SYRINGE | INTRAVENOUS | Status: DC | PRN
  Administered 2021-11-17: 40 mg via INTRAVENOUS

## 2021-11-17 MED ORDER — DOUBLE ANTIBIOTIC 500-10000 UNIT/GM EX OINT
TOPICAL_OINTMENT | CUTANEOUS | Status: AC
  Filled 2021-11-17: qty 28.4

## 2021-11-17 MED ORDER — GLYCOPYRROLATE 0.2 MG/ML IJ SOLN
INTRAMUSCULAR | Status: DC | PRN
  Administered 2021-11-17: .2 mg via INTRAVENOUS

## 2021-11-17 MED ORDER — DEXAMETHASONE SODIUM PHOSPHATE 10 MG/ML IJ SOLN
INTRAMUSCULAR | Status: DC | PRN
  Administered 2021-11-17: 10 mg via INTRAVENOUS

## 2021-11-17 MED ORDER — FENTANYL CITRATE (PF) 100 MCG/2ML IJ SOLN
INTRAMUSCULAR | Status: AC
  Filled 2021-11-17: qty 2

## 2021-11-17 MED ORDER — FENTANYL CITRATE (PF) 100 MCG/2ML IJ SOLN
INTRAMUSCULAR | Status: AC
  Administered 2021-11-17: 25 ug via INTRAVENOUS
  Filled 2021-11-17: qty 2

## 2021-11-17 MED ORDER — ACETAMINOPHEN 325 MG PO TABS: 325.0000 mg | ORAL_TABLET | Freq: Once | ORAL | Status: DC | PRN

## 2021-11-17 MED ORDER — CHLORHEXIDINE GLUCONATE 0.12 % MT SOLN
15.0000 mL | Freq: Once | OROMUCOSAL | Status: AC
  Administered 2021-11-17: 15 mL via OROMUCOSAL

## 2021-11-17 MED ORDER — PROPOFOL 10 MG/ML IV BOLUS
INTRAVENOUS | Status: DC | PRN
Start: 2021-11-17 — End: 2021-11-17
  Administered 2021-11-17 (×2): 100 mg via INTRAVENOUS

## 2021-11-17 MED ORDER — CEFAZOLIN SODIUM-DEXTROSE 2-4 GM/100ML-% IV SOLN
INTRAVENOUS | Status: AC
  Filled 2021-11-17: qty 100

## 2021-11-17 MED ORDER — ACETAMINOPHEN 10 MG/ML IV SOLN
INTRAVENOUS | Status: AC
  Filled 2021-11-17: qty 100

## 2021-11-17 MED ORDER — HYDROMORPHONE HCL 1 MG/ML IJ SOLN
0.2500 mg | INTRAMUSCULAR | Status: DC | PRN
  Administered 2021-11-17: 0.5 mg via INTRAVENOUS

## 2021-11-17 MED ORDER — ACETAMINOPHEN 10 MG/ML IV SOLN: 1000.0000 mg | Freq: Once | INTRAVENOUS | Status: DC | PRN

## 2021-11-17 MED ORDER — ESMOLOL HCL 100 MG/10ML IV SOLN
INTRAVENOUS | Status: DC | PRN
  Administered 2021-11-17: 20 mg via INTRAVENOUS

## 2021-11-17 MED ORDER — FENTANYL CITRATE (PF) 250 MCG/5ML IJ SOLN
INTRAMUSCULAR | Status: AC
  Filled 2021-11-17: qty 5

## 2021-11-17 MED ORDER — CEFAZOLIN SODIUM-DEXTROSE 2-3 GM-%(50ML) IV SOLR
INTRAVENOUS | Status: DC | PRN
  Administered 2021-11-17: 2 g via INTRAVENOUS

## 2021-11-17 MED ORDER — MEPERIDINE HCL 25 MG/ML IJ SOLN: 6.2500 mg | INTRAMUSCULAR | Status: DC | PRN

## 2021-11-17 MED ORDER — CEPHALEXIN 500 MG PO CAPS: 500.0000 mg | ORAL_CAPSULE | Freq: Three times a day (TID) | ORAL | 0 refills | Status: AC

## 2021-11-17 MED ORDER — SUGAMMADEX SODIUM 200 MG/2ML IV SOLN
INTRAVENOUS | Status: DC | PRN
  Administered 2021-11-17: 200 mg via INTRAVENOUS

## 2021-11-17 MED ORDER — LACTATED RINGERS IV SOLN: INTRAVENOUS | Status: DC

## 2021-11-17 MED ORDER — PHENYLEPHRINE HCL (PRESSORS) 10 MG/ML IV SOLN
INTRAVENOUS | Status: DC | PRN
  Administered 2021-11-17 (×3): 80 ug via INTRAVENOUS

## 2021-11-17 MED ORDER — LACTATED RINGERS IV SOLN: INTRAVENOUS | Status: DC | PRN

## 2021-11-17 MED ORDER — OXYMETAZOLINE HCL 0.05 % NA SOLN
NASAL | Status: DC | PRN
  Administered 2021-11-17 (×2): 2 via NASAL

## 2021-11-17 MED ORDER — ROCURONIUM 10MG/ML (10ML) SYRINGE FOR MEDFUSION PUMP - OPTIME
INTRAVENOUS | Status: DC | PRN
  Administered 2021-11-17: 60 mg via INTRAVENOUS

## 2021-11-17 MED ORDER — OXYMETAZOLINE HCL 0.05 % NA SOLN
NASAL | Status: DC | PRN
  Administered 2021-11-17: 1

## 2021-11-17 MED ORDER — MIDAZOLAM HCL 2 MG/2ML IJ SOLN
INTRAMUSCULAR | Status: AC
  Filled 2021-11-17: qty 2

## 2021-11-17 MED ORDER — LIDOCAINE-EPINEPHRINE 1 %-1:100000 IJ SOLN
INTRAMUSCULAR | Status: DC | PRN
  Administered 2021-11-17: 5 mL

## 2021-11-17 MED ORDER — LIDOCAINE-EPINEPHRINE 1 %-1:100000 IJ SOLN
INTRAMUSCULAR | Status: AC
  Filled 2021-11-17: qty 1

## 2021-11-17 MED ORDER — 0.9 % SODIUM CHLORIDE (POUR BTL) OPTIME
TOPICAL | Status: DC | PRN
  Administered 2021-11-17: 1000 mL

## 2021-11-17 MED ORDER — HYDROMORPHONE HCL 1 MG/ML IJ SOLN
INTRAMUSCULAR | Status: AC
  Filled 2021-11-17: qty 1

## 2021-11-17 MED ORDER — CHLORHEXIDINE GLUCONATE 0.12 % MT SOLN: 15.0000 mL | Freq: Once | OROMUCOSAL | Status: DC

## 2021-11-17 SURGICAL SUPPLY — 50 items
BAG COUNTER SPONGE SURGICOUNT (BAG) ×2 IMPLANT
BAG SPNG CNTER NS LX DISP (BAG) ×1
BIT DRILL 1.6X115 (BIT) ×1
BIT DRILL 1.6X115MM (BIT) IMPLANT
BLADE SURG 10 STRL SS (BLADE) IMPLANT
BLADE SURG 15 STRL LF DISP TIS (BLADE) IMPLANT
BLADE SURG 15 STRL SS (BLADE)
CANISTER SUCT 3000ML PPV (MISCELLANEOUS) ×2 IMPLANT
CLEANER TIP ELECTROSURG 2X2 (MISCELLANEOUS) ×2 IMPLANT
COVER SURGICAL LIGHT HANDLE (MISCELLANEOUS) ×4 IMPLANT
DECANTER SPIKE VIAL GLASS SM (MISCELLANEOUS) ×2 IMPLANT
DRAPE HALF SHEET 40X57 (DRAPES) IMPLANT
DRILL BIT 1.6X115MM (BIT) ×2
ELECT COATED BLADE 2.86 ST (ELECTRODE) IMPLANT
ELECT NDL BLADE 2-5/6 (NEEDLE) IMPLANT
ELECT NEEDLE BLADE 2-5/6 (NEEDLE) IMPLANT
ELECT REM PT RETURN 9FT ADLT (ELECTROSURGICAL) ×2
ELECTRODE REM PT RTRN 9FT ADLT (ELECTROSURGICAL) ×1 IMPLANT
GAUZE PACKING FOLDED 2  STR (GAUZE/BANDAGES/DRESSINGS) ×2
GAUZE PACKING FOLDED 2 STR (GAUZE/BANDAGES/DRESSINGS) IMPLANT
GLOVE ECLIPSE 7.5 STRL STRAW (GLOVE) ×2 IMPLANT
GOWN STRL REUS W/ TWL LRG LVL3 (GOWN DISPOSABLE) ×2 IMPLANT
GOWN STRL REUS W/TWL LRG LVL3 (GOWN DISPOSABLE) ×4
KIT BASIN OR (CUSTOM PROCEDURE TRAY) ×2 IMPLANT
KIT TURNOVER KIT B (KITS) ×2 IMPLANT
NDL HYPO 25GX1X1/2 BEV (NEEDLE) IMPLANT
NEEDLE HYPO 25GX1X1/2 BEV (NEEDLE) IMPLANT
NS IRRIG 1000ML POUR BTL (IV SOLUTION) ×2 IMPLANT
PAD ARMBOARD 7.5X6 YLW CONV (MISCELLANEOUS) ×4 IMPLANT
PATTIES SURGICAL .5 X3 (DISPOSABLE) IMPLANT
PENCIL FOOT CONTROL (ELECTRODE) ×2 IMPLANT
PLATE 4HOLE STR 1.6MM (Plate) ×1 IMPLANT
POSITIONER HEAD DONUT 9IN (MISCELLANEOUS) IMPLANT
PROTECTOR CORNEAL (OPHTHALMIC RELATED) IMPLANT
SCISSORS WIRE ANG 4 3/4 DISP (INSTRUMENTS) IMPLANT
SCREW BONE MANDIB SD 2X9 (Screw) ×1 IMPLANT
SCREW NON LOCK X-DR 2.0X12 (Screw) ×3 IMPLANT
SCREW NON LOCK X-DR 2.0X5 (Screw) ×1 IMPLANT
SUT CHROMIC 3 0 PS 2 (SUTURE) ×2 IMPLANT
SUT CHROMIC 4 0 PS 2 18 (SUTURE) ×2 IMPLANT
SUT ETHILON 5 0 P 3 18 (SUTURE)
SUT NYLON ETHILON 5-0 P-3 1X18 (SUTURE) ×1 IMPLANT
SUT SILK 2 0 PERMA HAND 18 BK (SUTURE) IMPLANT
SUT STEEL 0 (SUTURE)
SUT STEEL 0 18XMFL TIE 17 (SUTURE) IMPLANT
SUT STEEL 4 (SUTURE) ×1 IMPLANT
SUT VIC AB 4-0 PS2 18 (SUTURE) ×1 IMPLANT
TOWEL GREEN STERILE FF (TOWEL DISPOSABLE) ×2 IMPLANT
TRAY ENT MC OR (CUSTOM PROCEDURE TRAY) ×2 IMPLANT
WATER STERILE IRR 1000ML POUR (IV SOLUTION) ×2 IMPLANT

## 2021-11-17 NOTE — Anesthesia Preprocedure Evaluation (Signed)
Anesthesia Evaluation  ?Patient identified by MRN, date of birth, ID band ?Patient awake ? ? ? ?Reviewed: ?Allergy & Precautions, NPO status , Patient's Chart, lab work & pertinent test results ? ?Airway ?Mallampati: Unable to assess ? ? ? ? ? ? Dental ? ?(+) Teeth Intact ?  ?Pulmonary ?Current Smoker and Patient abstained from smoking.,  ?  ?Pulmonary exam normal ? ? ? ? ? ? ? Cardiovascular ?negative cardio ROS ?Normal cardiovascular exam ? ? ?  ?Neuro/Psych ?negative neurological ROS ? negative psych ROS  ? GI/Hepatic ?(+) Hepatitis -, C  ?Endo/Other  ? ? Renal/GU ?Renal disease  ? ?  ?Musculoskeletal ? ? Abdominal ?Normal abdominal exam  (+)   ?Peds ? Hematology ?  ?Anesthesia Other Findings ? ? Reproductive/Obstetrics ? ?  ? ? ? ? ? ? ? ? ? ? ? ? ? ?  ?  ? ? ? ? ? ? ? ? ?Anesthesia Physical ?Anesthesia Plan ? ?ASA: 2 ? ?Anesthesia Plan: General  ? ?Post-op Pain Management:   ? ?Induction: Intravenous ? ?PONV Risk Score and Plan: 3 and Ondansetron, Dexamethasone and Midazolam ? ?Airway Management Planned: Nasal ETT ? ?Additional Equipment: None ? ?Intra-op Plan:  ? ?Post-operative Plan: Extubation in OR ? ?Informed Consent: I have reviewed the patients History and Physical, chart, labs and discussed the procedure including the risks, benefits and alternatives for the proposed anesthesia with the patient or authorized representative who has indicated his/her understanding and acceptance.  ? ? ? ?Dental advisory given ? ?Plan Discussed with: CRNA ? ?Anesthesia Plan Comments:   ? ? ? ? ? ? ?Anesthesia Quick Evaluation ? ?

## 2021-11-17 NOTE — H&P (Signed)
Isabella Powell is an 51 y.o. female.   ?Chief Complaint: mandible fracture ?HPI: hx of ORIF of mandible fracture and then tooth extraction by Dr Ross Marcus. She has continued with pain in the left angle since. She is here for orif of the left angle. She was scheduled last week for 12:50 but she did not show up util 12:30. Since Dr Ross Marcus who will be doing the surgery was traveling out of town we waited until 12 noon and at that point she still had not arrived she was cancelled. She was scheduled to be here at 10:30.  ? ?Past Medical History:  ?Diagnosis Date  ? Anemia   ? Bartholin cyst   ? Hepatitis   ? Hepatitis C   ? Psoriasis-like skin disease   ? Renal disorder   ? Trichomonas   ? patient states that many years ago  ? ? ?Past Surgical History:  ?Procedure Laterality Date  ? CESAREAN SECTION    ? ORIF MANDIBULAR FRACTURE N/A 10/24/2021  ? Procedure: OPEN REDUCTION INTERNAL FIXATION (ORIF) MANDIBULAR FRACTURE;  Surgeon: Suzanna Obey, MD;  Location: [facility] OR;  Service: ENT;  Laterality: N/A;  ? TOOTH EXTRACTION N/A 10/26/2021  ? Procedure: DENTAL EXTRACTIONS, MAXILLO MANDIBULAR FIXATION;  Surgeon: Exie Parody, DMD;  Location[facility] OR;  Service: Oral Surgery;  Laterality: N/A;  ? TUBAL LIGATION    ? bilateral  ? ? ?Family History  ?Problem Relation Age of Onset  ? Cancer Father   ? Emphysema Father   ? ?Social History:  reports that she has been smoking cigarettes. She has been smoking an average of .5 packs per day. She has never used smokeless tobacco. She reports current alcohol use. She reports current drug use. Drug: Marijuana. ? ?Allergies: No Known Allergies ? ?Medications Prior to Admission  ?Medication Sig Dispense Refill  ? Aspirin-Caffeine (BACK & BODY EXTRA STRENGTH PO) Take 2 tablets by mouth every 6 (six) hours as needed (pain).    ? Doxylamine-DM & DM (VICKS DAYQUIL/NYQUIL COUGH) 6.25-15 & 15 MG/15ML LQPK Take 15 mLs by mouth every 6 (six) hours as needed (pain).    ? ? ?No results found for this or  any previous visit (from the past 48 hour(s)). ?No results found. ? ?Review of Systems ? ?Blood pressure (!) 126/92, pulse 94, temperature 97.9 ?F (36.6 ?C), temperature source Axillary, resp. rate 18, height 5' 7.5" (1.715 m), weight 54 kg, last menstrual period 12/22/2015, SpO2 100 %. ?Physical Exam ?HENT:  ?   Head: Normocephalic.  ?   Nose: Nose normal.  ?   Mouth/Throat:  ?   Comments: Swelling of the left side. Tender to palpation. Teeth still wired shut ?Cardiovascular:  ?   Rate and Rhythm: Normal rate.  ?Pulmonary:  ?   Effort: Pulmonary effort is normal.  ?Abdominal:  ?   General: Abdomen is flat.  ?Musculoskeletal:     ?   General: Normal range of motion.  ?Neurological:  ?   Mental Status: She is alert.  ?  ? ?Assessment/Plan ?Mandible fracture- she is for ORIF of left angle fracture after nonhealing. She was informed risks, benefits, and options. All questions were answered and consent obtained.  ? ?Suzanna Obey, MD ?11/17/2021, 1:50 PM ? ? ? ?

## 2021-11-17 NOTE — Transfer of Care (Signed)
Immediate Anesthesia Transfer of Care Note ? ?Patient: Isabella Powell ? ?Procedure(s) Performed: REVSION OPEN REDUCTION INTERNAL FIXATION (ORIF) MANDIBULAR FRACTURE (Mouth) ? ?Patient Location: PACU ? ?Anesthesia Type:General ? ?Level of Consciousness: awake, alert  and oriented ? ?Airway & Oxygen Therapy: Patient Spontanous Breathing ? ?Post-op Assessment: Report given to RN and Post -op Vital signs reviewed and stable ? ?Post vital signs: Reviewed and stable ? ?Last Vitals:  ?Vitals Value Taken Time  ?BP 125/93 11/17/21 1522  ?Temp    ?Pulse    ?Resp 14 11/17/21 1525  ?SpO2    ?Vitals shown include unvalidated device data. ? ?Last Pain:  ?Vitals:  ? 11/17/21 1105  ?TempSrc: Axillary  ?   ? ?  ? ?Complications: No notable events documented. ?

## 2021-11-17 NOTE — Progress Notes (Signed)
Verbal order received from Shona Simpson to give fentanyl x2 15 minutes apart.  ?

## 2021-11-17 NOTE — Interval H&P Note (Signed)
History and Physical Interval Note: ? ?11/17/2021 ?1:50 PM ? ?Isabella Powell  has presented today for surgery, with the diagnosis of REVISION OF ORIF MANDIBLE FRACTURE.  The various methods of treatment have been discussed with the patient and family. After consideration of risks, benefits and other options for treatment, the patient has consented to  Procedure(s): ?REVSION OPEN REDUCTION INTERNAL FIXATION (ORIF) MANDIBULAR FRACTURE (N/A) as a surgical intervention.  The patient's history has been reviewed, patient examined, no change in status, stable for surgery.  I have reviewed the patient's chart and labs.  Questions were answered to the patient's satisfaction.   ? ? ?Suzanna Obey ? ? ?

## 2021-11-17 NOTE — Anesthesia Procedure Notes (Signed)
Procedure Name: Intubation ?Date/Time: 11/17/2021 2:04 PM ?Performed by: Claris Che, CRNA ?Pre-anesthesia Checklist: Patient identified, Emergency Drugs available, Suction available, Patient being monitored and Timeout performed ?Patient Re-evaluated:Patient Re-evaluated prior to induction ?Oxygen Delivery Method: Circle system utilized ?Preoxygenation: Pre-oxygenation with 100% oxygen ?Induction Type: IV induction and Cricoid Pressure applied ?Ventilation: Mask ventilation without difficulty ?Laryngoscope Size: Mac and 4 ?Grade View: Grade I ?Nasal Tubes: Right, Nasal prep performed and Nasal Dwyane Luo ?Endobronchial tube: Right ?Tube size: 7.0 mm ?Number of attempts: 1 ?Placement Confirmation: ETT inserted through vocal cords under direct vision, positive ETCO2 and breath sounds checked- equal and bilateral ?Tube secured with: Tape ?Dental Injury: Teeth and Oropharynx as per pre-operative assessment  ? ? ? ? ?

## 2021-11-18 ENCOUNTER — Encounter (HOSPITAL_COMMUNITY): Payer: Self-pay | Admitting: Otolaryngology

## 2021-11-18 NOTE — Anesthesia Postprocedure Evaluation (Signed)
Anesthesia Post Note ? ?Patient: IVER FEHRENBACH ? ?Procedure(s) Performed: REVSION OPEN REDUCTION INTERNAL FIXATION (ORIF) MANDIBULAR FRACTURE (Mouth) ? ?  ? ?Patient location during evaluation: PACU ?Anesthesia Type: General ?Level of consciousness: awake and alert ?Pain management: pain level controlled ?Vital Signs Assessment: post-procedure vital signs reviewed and stable ?Respiratory status: spontaneous breathing, nonlabored ventilation, respiratory function stable and patient connected to nasal cannula oxygen ?Cardiovascular status: blood pressure returned to baseline and stable ?Postop Assessment: no apparent nausea or vomiting ?Anesthetic complications: no ? ? ?No notable events documented. ? ?Last Vitals:  ?Vitals:  ? 11/17/21 1607 11/17/21 1622  ?BP: 106/80 118/87  ?Pulse: 81 81  ?Resp: 13 (!) 21  ?Temp:  36.7 ?C  ?SpO2: 100% 98%  ?  ?Last Pain:  ?Vitals:  ? 11/17/21 1622  ?TempSrc:   ?PainSc: 3   ? ? ?  ?  ?  ?  ?  ?  ? ?Shelton Silvas ? ? ? ? ?
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
## 2021-11-25 ENCOUNTER — Telehealth: Payer: Self-pay

## 2021-11-25 NOTE — Telephone Encounter (Signed)
Pt called in stating she was in a lot of pain and was wanting Dr. Jearld Fenton to refill her Hydrocodone. I will forward to Dr. Jearld Fenton. ?

## 2021-11-25 NOTE — Telephone Encounter (Signed)
Task has been handled by another CMA and has been sent to Dr. Jearld Fenton CMA. Pt is aware.  ?

## 2022-02-09 ENCOUNTER — Telehealth: Payer: Self-pay | Admitting: Otolaryngology
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
## 2022-02-09 NOTE — Telephone Encounter (Signed)
Per Dr. Jearld Fenton, patient is to follow up with  Herbie Saxon, DDS

## 2022-02-24 ENCOUNTER — Ambulatory Visit (INDEPENDENT_AMBULATORY_CARE_PROVIDER_SITE_OTHER): Payer: Medicaid Other | Admitting: Otolaryngology

## 2022-02-25 ENCOUNTER — Ambulatory Visit (INDEPENDENT_AMBULATORY_CARE_PROVIDER_SITE_OTHER): Payer: Medicaid Other | Admitting: Otolaryngology

## 2022-02-28 ENCOUNTER — Ambulatory Visit (INDEPENDENT_AMBULATORY_CARE_PROVIDER_SITE_OTHER): Payer: Medicaid Other | Admitting: Otolaryngology

## 2022-02-28 ENCOUNTER — Other Ambulatory Visit: Payer: Self-pay | Admitting: Otolaryngology

## 2022-02-28 DIAGNOSIS — S0269XG Fracture of mandible of other specified site, subsequent encounter for fracture with delayed healing: Secondary | ICD-10-CM

## 2022-02-28 DIAGNOSIS — S02642G Fracture of ramus of left mandible, subsequent encounter for fracture with delayed healing: Secondary | ICD-10-CM

## 2022-02-28 MED ORDER — HYDROCODONE-ACETAMINOPHEN 5-325 MG PO TABS
1.0000 | ORAL_TABLET | Freq: Four times a day (QID) | ORAL | 0 refills | Status: AC | PRN
Start: 2022-02-28 — End: 2022-03-03

## 2022-02-28 MED ORDER — CEPHALEXIN 500 MG PO CAPS: 500.0000 mg | ORAL_CAPSULE | Freq: Three times a day (TID) | ORAL | 0 refills | Status: AC
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
## 2022-05-29 ENCOUNTER — Emergency Department (HOSPITAL_COMMUNITY): Payer: Medicaid Other

## 2022-05-29 ENCOUNTER — Other Ambulatory Visit: Payer: Self-pay

## 2022-05-29 ENCOUNTER — Encounter (HOSPITAL_COMMUNITY): Payer: Self-pay

## 2022-05-29 ENCOUNTER — Emergency Department (HOSPITAL_COMMUNITY)
Admission: EM | Admit: 2022-05-29 | Discharge: 2022-05-30 | Disposition: A | Discharge status: Home / Self Care | Payer: Medicaid Other | Attending: Emergency Medicine | Admitting: Emergency Medicine

## 2022-05-29 DIAGNOSIS — S61210A Laceration without foreign body of right index finger without damage to nail, initial encounter: Secondary | ICD-10-CM | POA: Diagnosis not present

## 2022-05-29 DIAGNOSIS — S61214A Laceration without foreign body of right ring finger without damage to nail, initial encounter: Secondary | ICD-10-CM | POA: Insufficient documentation

## 2022-05-29 DIAGNOSIS — S0990XA Unspecified injury of head, initial encounter: Secondary | ICD-10-CM | POA: Diagnosis not present

## 2022-05-29 DIAGNOSIS — M7989 Other specified soft tissue disorders: Secondary | ICD-10-CM | POA: Insufficient documentation

## 2022-05-29 DIAGNOSIS — S6991XA Unspecified injury of right wrist, hand and finger(s), initial encounter: Secondary | ICD-10-CM | POA: Diagnosis present

## 2022-05-29 DIAGNOSIS — S61216A Laceration without foreign body of right little finger without damage to nail, initial encounter: Secondary | ICD-10-CM | POA: Diagnosis not present

## 2022-05-29 DIAGNOSIS — S61212A Laceration without foreign body of right middle finger without damage to nail, initial encounter: Secondary | ICD-10-CM | POA: Diagnosis not present

## 2022-05-29 MED ORDER — TETANUS-DIPHTH-ACELL PERTUSSIS 5-2.5-18.5 LF-MCG/0.5 IM SUSY: 0.5000 mL | PREFILLED_SYRINGE | Freq: Once | INTRAMUSCULAR | Status: DC

## 2022-05-29 MED ORDER — LIDOCAINE HCL (PF) 1 % IJ SOLN
30.0000 mL | Freq: Once | INTRAMUSCULAR | Status: AC
  Administered 2022-05-29: 30 mL
  Filled 2022-05-29: qty 30

## 2022-05-29 MED ORDER — OXYCODONE-ACETAMINOPHEN 5-325 MG PO TABS
1.0000 | ORAL_TABLET | Freq: Once | ORAL | Status: AC
  Administered 2022-05-29: 1 via ORAL
  Filled 2022-05-29: qty 1

## 2022-05-29 NOTE — Discharge Instructions (Signed)
Please call the orthopedic doctor listed.  They need to see you for reassessment and evaluation of your lacerations due to concern for tendon injury.  The x-ray of your hand and CTs scans of your head and face revealed no broken bones.   Conserv[facility], museum/[facility]) Monday - Friday 8am - 3pm          Sat & Sun 8am - 2pm [address] [place], [place] [address]   [phone]     [email or web address] [facility] offers among other critical resources: showers, laundry, barbershop, phone bank, mailroom, computer lab, medical clinic, gardens and a bike maintenance area.   AREA SHELTERS  [facility] [employer]  (Men & women) [address] [place] [phone]  [facility] (Men/women/families) [address] [place] [phone]   [facility] (Families with children) [phone]  [place] [phone]   [facility] (Domestic Violence Shelter) [address]  [place] [phone]   [facility] (Children ages 4-17) [address]  [place] [phone]   [facility]   (Women & children) [address] [place] [phone]   [facility] (Women/substance abuse) [address]  [place] [phone]   [facility] (Men) [address]   [phone]   [facility] (Men) [address]  [place] [phone]  [employer] (Women) [phone]  [place] [phone]   [facility] (Single women & women with children) [address]  [place] [phone]  [facility] (Men/women/families) [address] [phone]    [facility]   (Domestic Violence shelter) [address]  [place] [phone]   [facility] (Men & women) [address]  [place] [phone]  [facility] (Men) [address]  [employer] [phone]    [facility] (Men) [address]  [place] [phone]   [facility] (Single women & families) [address]  [place] [phone]  [facility] (Men/women & families) [address]  [place] [phone]    If you are at risk of losing your housing (throughout [facility]) call the Housing Hotline at (401)365-1475. You may also contact 2-1-1, a FREE service of the [address] that provides information about many resources including housing. Dial 211, or visit online at PooledIncome.pl. [facility]:  [facility] [place], contact Janelle Floor [phone] (men only)                          [facility] in [place]:  90 day homeless program for women and men;                             contact Rev. Chambers [phone]  [facility]:  [facility]:  men/women/children [phone]  [facility]:  Shelter in [place], [place] [phone]                       [facility] in [place], [place] [phone]   [facility]:  [facility] for Abused Women, [phone]; (women and children)  [facility]:  [employer], men/women/children; [phone]                           [facility] in [place], [phone]; substance abuse halfway house for  men              [facility]; 4 bedroom house in [facility] for homeless women, contact [facility] [phone]               [facility] in [place], [phone] (women and children)               [facility], for abused women and children, 24 hour crisis line, [phone], [place]  [facility], halfway house for women, [place], [phone]  [facility]:  [facility], [phone]; open Mon-Thurs from UUV25 - March 15 when temp is below 32 degrees                              [facility]; [place], [phone]; cell [phone]; open 24/7  [facility]:  Outreach for [place] - Rev Ladona Ridgel - [phone]  [place]:  transitional housing for women and children; [place] [phone]
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
## 2022-05-29 NOTE — ED Notes (Signed)
Pt ambulatory without assistance.  

## 2022-05-29 NOTE — ED Notes (Signed)
Pt called x1 in ED lobby. Pt could not be located within ED lobby, and did not respond to name call.   °

## 2022-05-29 NOTE — Care Management (Signed)
Homeless Shelter resources added to the chart. The patient will have to call to be interviewed etc for housing.
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
## 2022-05-30 ENCOUNTER — Encounter (HOSPITAL_COMMUNITY): Payer: Self-pay | Admitting: Emergency Medicine

## 2022-05-30 ENCOUNTER — Emergency Department (HOSPITAL_COMMUNITY): Payer: Medicaid Other

## 2022-05-30 NOTE — ED Notes (Signed)
Ortho tech paged  

## 2022-05-30 NOTE — Progress Notes (Signed)
Orthopedic Tech Progress Note Patient Details:  Isabella Powell 1970/12/19 [id number]  Ortho Devices Type of Ortho Device: Volar splint Ortho Device/Splint Location: rue long volar Ortho Device/Splint Interventions: Ordered, Adjustment, Application  I applied a long volar at drs request. He requested more extension than flexion but not completely extended. Post Interventions Patient Tolerated: Well Instructions Provided: Care of device, Adjustment of device  [name] Post 05/30/2022, 1:01 AM

## 2022-06-28 ENCOUNTER — Other Ambulatory Visit: Payer: Self-pay | Admitting: Nurse Practitioner

## 2022-06-28 DIAGNOSIS — Z1231 Encounter for screening mammogram for malignant neoplasm of breast: Secondary | ICD-10-CM

## 2022-12-15 ENCOUNTER — Emergency Department (HOSPITAL_COMMUNITY): Payer: Medicaid Other

## 2022-12-15 ENCOUNTER — Encounter (HOSPITAL_COMMUNITY): Payer: Self-pay | Admitting: *Deleted

## 2022-12-15 ENCOUNTER — Other Ambulatory Visit: Payer: Self-pay

## 2022-12-15 ENCOUNTER — Emergency Department (HOSPITAL_COMMUNITY)
Admission: EM | Admit: 2022-12-15 | Discharge: 2022-12-15 | Disposition: A | Discharge status: Home / Self Care | Payer: Medicaid Other | Attending: Emergency Medicine | Admitting: Emergency Medicine

## 2022-12-15 DIAGNOSIS — D72819 Decreased white blood cell count, unspecified: Secondary | ICD-10-CM | POA: Insufficient documentation

## 2022-12-15 DIAGNOSIS — R1012 Left upper quadrant pain: Secondary | ICD-10-CM | POA: Insufficient documentation

## 2022-12-15 DIAGNOSIS — E878 Other disorders of electrolyte and fluid balance, not elsewhere classified: Secondary | ICD-10-CM | POA: Insufficient documentation

## 2022-12-15 DIAGNOSIS — E871 Hypo-osmolality and hyponatremia: Secondary | ICD-10-CM | POA: Insufficient documentation

## 2022-12-15 DIAGNOSIS — R112 Nausea with vomiting, unspecified: Secondary | ICD-10-CM | POA: Insufficient documentation

## 2022-12-15 DIAGNOSIS — R1013 Epigastric pain: Secondary | ICD-10-CM | POA: Diagnosis not present

## 2022-12-15 LAB — CBC
HCT: 37.8 % (ref 36.0–46.0)
Hemoglobin: 12.5 g/dL (ref 12.0–15.0)
MCH: 34.3 pg — ABNORMAL HIGH (ref 26.0–34.0)
MCHC: 33.1 g/dL (ref 30.0–36.0)
MCV: 103.8 fL — ABNORMAL HIGH (ref 80.0–100.0)
Platelets: 137 10*3/uL — ABNORMAL LOW (ref 150–400)
RBC: 3.64 MIL/uL — ABNORMAL LOW (ref 3.87–5.11)
RDW: 12.3 % (ref 11.5–15.5)
WBC: 2.4 10*3/uL — ABNORMAL LOW (ref 4.0–10.5)
nRBC: 0 % (ref 0.0–0.2)

## 2022-12-15 LAB — URINALYSIS, ROUTINE W REFLEX MICROSCOPIC
Bacteria, UA: NONE SEEN
Glucose, UA: NEGATIVE mg/dL
Hgb urine dipstick: NEGATIVE
Ketones, ur: 5 mg/dL — AB
Nitrite: NEGATIVE
Protein, ur: 30 mg/dL — AB
Specific Gravity, Urine: 1.021 (ref 1.005–1.030)
pH: 7 (ref 5.0–8.0)

## 2022-12-15 LAB — LIPASE, BLOOD: Lipase: 51 U/L (ref 11–51)

## 2022-12-15 LAB — COMPREHENSIVE METABOLIC PANEL
ALT: 108 U/L — ABNORMAL HIGH (ref 0–44)
AST: 181 U/L — ABNORMAL HIGH (ref 15–41)
Albumin: 3.7 g/dL (ref 3.5–5.0)
Alkaline Phosphatase: 89 U/L (ref 38–126)
Anion gap: 8 (ref 5–15)
BUN: 9 mg/dL (ref 6–20)
CO2: 26 mmol/L (ref 22–32)
Calcium: 9.1 mg/dL (ref 8.9–10.3)
Chloride: 96 mmol/L — ABNORMAL LOW (ref 98–111)
Creatinine, Ser: 0.82 mg/dL (ref 0.44–1.00)
GFR, Estimated: >60 mL/min (ref >60)
Glucose, Bld: 111 mg/dL — ABNORMAL HIGH (ref 70–99)
Potassium: 3.5 mmol/L (ref 3.5–5.1)
Sodium: 130 mmol/L — ABNORMAL LOW (ref 135–145)
Total Bilirubin: 2.4 mg/dL — ABNORMAL HIGH (ref 0.3–1.2)
Total Protein: 9.9 g/dL — ABNORMAL HIGH (ref 6.5–8.1)

## 2022-12-15 MED ORDER — ONDANSETRON 4 MG PO TBDP: 4.0000 mg | ORAL_TABLET | Freq: Three times a day (TID) | ORAL | 0 refills | Status: DC | PRN

## 2022-12-15 MED ORDER — LORAZEPAM 2 MG/ML IJ SOLN
1.0000 mg | Freq: Once | INTRAMUSCULAR | Status: AC
  Administered 2022-12-15: 1 mg via INTRAVENOUS
  Filled 2022-12-15: qty 1

## 2022-12-15 MED ORDER — SODIUM CHLORIDE 0.9 % IV BOLUS
1000.0000 mL | Freq: Once | INTRAVENOUS | Status: AC
  Administered 2022-12-15: 1000 mL via INTRAVENOUS

## 2022-12-15 MED ORDER — TRIAMCINOLONE ACETONIDE 0.1 % EX CREA: 1.0000 | TOPICAL_CREAM | Freq: Two times a day (BID) | CUTANEOUS | 0 refills | Status: DC

## 2022-12-15 MED ORDER — TRIAMCINOLONE ACETONIDE 0.1 % EX CREA: 1.0000 | TOPICAL_CREAM | Freq: Two times a day (BID) | CUTANEOUS | 0 refills | Status: AC

## 2022-12-15 MED ORDER — ONDANSETRON 4 MG PO TBDP: 4.0000 mg | ORAL_TABLET | Freq: Three times a day (TID) | ORAL | 0 refills | Status: AC | PRN

## 2022-12-15 MED ORDER — ONDANSETRON HCL 4 MG/2ML IJ SOLN
4.0000 mg | Freq: Once | INTRAMUSCULAR | Status: AC
  Administered 2022-12-15: 4 mg via INTRAVENOUS
  Filled 2022-12-15: qty 2

## 2022-12-15 NOTE — Discharge Instructions (Addendum)
Please take Zofran as needed for nausea and vomiting.  I would like for you to stick to a diet that is lighter on your stomach.  I would consider bananas, rice, applesauce, toast, soup broths, and other liquids.  You can slowly incorporate a normal diet in the coming days.  Please return to the emergency permit for any worsening symptoms.

## 2022-12-15 NOTE — ED Triage Notes (Signed)
Pt has had n/v/d since Monday.

## 2022-12-15 NOTE — ED Provider Notes (Signed)
[place] EMERGENCY DEPARTMENT AT [facility] Provider Note   CSN: [id number] Arrival date & time: 12/15/22  1709     History Chief Complaint  Patient presents with   Emesis    Isabella Powell is a 52 y.o. female patient with hx of binge drinking, marijuana use who presents to the emergency department with upper abdominal pain in addition to intractable nausea and vomiting this been ongoing for last 4 days.  Patient states that she drank in excess over the weekend.  She states she typically does not drink daily but when she does drink she will drink 80 ounces of alcohol.  She states she has been unable to keep anything down for 4 days.  She also endorses some diarrhea.  She denies any urinary complaints, fever, chills.   Emesis      Home Medications Prior to Admission medications   Medication Sig Start Date End Date Taking? Authorizing Provider  ondansetron (ZOFRAN-ODT) 4 MG disintegrating tablet Take 1 tablet (4 mg total) by mouth every 8 (eight) hours as needed for nausea or vomiting. 12/15/22  Yes Meredeth Ide, Kamuela Magos M, PA-C  Aspirin-Caffeine (BACK & BODY EXTRA STRENGTH PO) Take 2 tablets by mouth every 6 (six) hours as needed (pain).    [provider]  Doxylamine-DM & DM (VICKS DAYQUIL/NYQUIL COUGH) 6.25-15 & 15 MG/15ML LQPK Take 15 mLs by mouth every 6 (six) hours as needed (pain).    [provider]  Ibuprofen (ADVIL PO) Take by mouth.    [provider]      Allergies    Patient has no known allergies.    Review of Systems   Review of Systems  Gastrointestinal:  Positive for vomiting.  All other systems reviewed and are negative.   Physical Exam Updated Vital Signs BP 103/74 (BP Location: Right Arm)   Pulse 70   Temp 98.2 F (36.8 C) (Oral)   Resp 18   LMP 12/22/2015   SpO2 100%  Physical Exam Vitals and nursing note reviewed.  Constitutional:      General: She is not in acute distress.    Appearance: Normal appearance.   HENT:     Head: Normocephalic and atraumatic.  Eyes:     General:        Right eye: No discharge.        Left eye: No discharge.  Cardiovascular:     Comments: Regular rate and rhythm.  S1/S2 are distinct without any evidence of murmur, rubs, or gallops.  Radial pulses are 2+ bilaterally.  Dorsalis pedis pulses are 2+ bilaterally.  No evidence of pedal edema. Pulmonary:     Comments: Clear to auscultation bilaterally.  Normal effort.  No respiratory distress.  No evidence of wheezes, rales, or rhonchi heard throughout. Abdominal:     General: Abdomen is flat. Bowel sounds are normal. There is no distension.     Tenderness: There is abdominal tenderness in the epigastric area and left upper quadrant. There is no guarding or rebound.  Musculoskeletal:        General: Normal range of motion.     Cervical back: Neck supple.  Skin:    General: Skin is warm and dry.     Findings: No rash.  Neurological:     General: No focal deficit present.     Mental Status: She is alert.  Psychiatric:        Mood and Affect: Mood normal.        Behavior: Behavior  normal.     ED Results / Procedures / Treatments   Labs (all labs ordered are listed, but only abnormal results are displayed) Labs Reviewed  COMPREHENSIVE METABOLIC PANEL - Abnormal; Notable for the following components:      Result Value   Sodium 130 (*)    Chloride 96 (*)    Glucose, Bld 111 (*)    Total Protein 9.9 (*)    AST 181 (*)    ALT 108 (*)    Total Bilirubin 2.4 (*)    All other components within normal limits  CBC - Abnormal; Notable for the following components:   WBC 2.4 (*)    RBC 3.64 (*)    MCV 103.8 (*)    MCH 34.3 (*)    Platelets 137 (*)    All other components within normal limits  URINALYSIS, ROUTINE W REFLEX MICROSCOPIC - Abnormal; Notable for the following components:   Color, Urine AMBER (*)    Bilirubin Urine SMALL (*)    Ketones, ur 5 (*)    Protein, ur 30 (*)    Leukocytes,Ua TRACE (*)     All other components within normal limits  LIPASE, BLOOD    EKG None  Radiology US Abdomen Limited RUQ (LIVER/GB)  Result Date: 12/15/2022 CLINICAL DATA:  Elevated liver enzymes EXAM: ULTRASOUND ABDOMEN LIMITED RIGHT UPPER QUADRANT COMPARISON:  CT 02/15/2021 FINDINGS: Gallbladder: No shadowing stones. Gallbladder sludge. Upper normal wall thickness. Negative sonographic Murphy. Common bile duct: Diameter: 3 mm Liver: Echogenic liver parenchyma. No focal hepatic abnormality. Portal vein is patent on color Doppler imaging with normal direction of blood flow towards the liver. Other: None. IMPRESSION: 1. Echogenic liver parenchyma consistent with hepatic steatosis and or hepatocellular disease. 2. Gallbladder sludge. Electronically Signed   By: Jasmine Pang M.D.   On: 12/15/2022 19:45    Procedures Procedures    Medications Ordered in ED Medications  LORazepam (ATIVAN) injection 1 mg (1 mg Intravenous Given 12/15/22 1818)  sodium chloride 0.9 % bolus 1,000 mL (0 mLs Intravenous Stopped 12/15/22 1921)  ondansetron (ZOFRAN) injection 4 mg (4 mg Intravenous Given 12/15/22 1818)    ED Course/ Medical Decision Making/ A&P Clinical Course as of 12/15/22 2127  Thu Dec 15, 2022  1823 Lipase, blood Normal.  [CF]  1823 CBC(!) There is evidence of leukopenia.  [CF]  1823 Comprehensive metabolic panel(!) Mild hyponatremia and hypochloremia.  [CF]  2122 Urinalysis, Routine w reflex microscopic -Urine, Clean Catch(!) Small amount of white blood cells and some trace leukocytes.  Patient not complaining of urinary symptoms.  Will hold off on treatment as this is a borderline urinary study at this time. [CF]  2122 US Abdomen Limited RUQ (LIVER/GB) I personally ordered and interpreted the study.  There is some hepatic steatosis but no other acute findings.  I do agree with radiologist interpretation. [CF]  2123 On reevaluation, patient tolerated fluid challenge well without any episodes of vomiting.   Will plan to discharge her home with Zofran. [CF]    Clinical Course User Index [CF] Teressa Lower, PA-C   {   Click here for ABCD2, HEART and other calculators  Medical Decision Making Shavonna C Rehmert is a 52 y.o. female patient who presents to the emergency department today for further evaluation of nausea, vomiting, and diarrhea.  Will plan to get some abdominal labs.  I am suspicious for pancreatitis considering alcohol use and location of her pain.  This also could be gastritis, ulcers, cholecystitis.  Considering  the transaminitis although this is normal for the patient I will likely get a right upper quadrant ultrasound considering that the patient is having pain in the upper abdomen.  I will also give her antiemetics as well.  Patient was fluid challenge with success.  No vomiting 30 minutes post trial.  Ultrasound was normal apart from some hepatic steatosis.  Have given her Zofran to go home with.  No more vomiting.  Strict turn precaution were discussed.  I have educated her on appropriate diet in the coming days.  She is safe for discharge at this time.   Amount and/or Complexity of Data Reviewed Labs: ordered. Decision-making details documented in ED Course. Radiology: ordered.  Risk Prescription drug management.   Final Clinical Impression(s) / ED Diagnoses Final diagnoses:  Nausea and vomiting, unspecified vomiting type    Rx / DC Orders ED Discharge Orders          Ordered    ondansetron (ZOFRAN-ODT) 4 MG disintegrating tablet  Every 8 hours PRN        12/15/22 2124              Teressa Lower, PA-C 12/15/22 2128    Charlynne Pander, MD 12/15/22 [phone]

## 2023-01-04 ENCOUNTER — Emergency Department (HOSPITAL_COMMUNITY)
Admission: EM | Admit: 2023-01-04 | Discharge: 2023-01-04 | Disposition: A | Discharge status: Home / Self Care | Payer: Medicaid Other | Attending: Emergency Medicine | Admitting: Emergency Medicine

## 2023-01-04 ENCOUNTER — Encounter (HOSPITAL_COMMUNITY): Payer: Self-pay

## 2023-01-04 ENCOUNTER — Other Ambulatory Visit: Payer: Self-pay

## 2023-01-04 DIAGNOSIS — F17210 Nicotine dependence, cigarettes, uncomplicated: Secondary | ICD-10-CM | POA: Insufficient documentation

## 2023-01-04 DIAGNOSIS — R0602 Shortness of breath: Secondary | ICD-10-CM | POA: Diagnosis not present

## 2023-01-04 DIAGNOSIS — R0600 Dyspnea, unspecified: Secondary | ICD-10-CM | POA: Diagnosis present

## 2023-01-04 MED ORDER — TRIAMCINOLONE ACETONIDE 0.1 % EX CREA
1.0000 | TOPICAL_CREAM | Freq: Two times a day (BID) | CUTANEOUS | Status: DC
  Administered 2023-01-04: 1 via TOPICAL
  Filled 2023-01-04: qty 15

## 2023-01-04 NOTE — Discharge Instructions (Signed)
As discussed, your evaluation today has been largely reassuring.  But, it is important that you monitor your condition carefully, and do not hesitate to return to the ED if you develop new, or concerning changes in your condition. ? ?Otherwise, please follow-up with your physician for appropriate ongoing care. ? ?

## 2023-01-04 NOTE — ED Triage Notes (Signed)
Patient presented to ER from home for [facility] EMS from home. Per EMS reports patient has not slept and has been drinking alcohol for 2 days. Patient stating she just doesn't feel right and hurting all over. VSS.
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
## 2023-04-24 ENCOUNTER — Encounter (HOSPITAL_COMMUNITY): Payer: Self-pay

## 2023-04-24 ENCOUNTER — Emergency Department (HOSPITAL_COMMUNITY)
Admission: EM | Admit: 2023-04-24 | Discharge: 2023-04-24 | Disposition: A | Discharge status: Home / Self Care | Payer: Medicaid Other | Attending: Emergency Medicine | Admitting: Emergency Medicine

## 2023-04-24 ENCOUNTER — Other Ambulatory Visit: Payer: Self-pay

## 2023-04-24 DIAGNOSIS — M791 Myalgia, unspecified site: Secondary | ICD-10-CM | POA: Diagnosis not present

## 2023-04-24 DIAGNOSIS — Z7982 Long term (current) use of aspirin: Secondary | ICD-10-CM | POA: Insufficient documentation

## 2023-04-24 DIAGNOSIS — L409 Psoriasis, unspecified: Secondary | ICD-10-CM | POA: Insufficient documentation

## 2023-04-24 MED ORDER — HYDROCORTISONE 1 % EX CREA: TOPICAL_CREAM | CUTANEOUS | 0 refills | Status: AC

## 2023-04-24 MED ORDER — HYDROXYZINE HCL 25 MG PO TABS
25.0000 mg | ORAL_TABLET | Freq: Once | ORAL | Status: DC
  Filled 2023-04-24: qty 1

## 2023-04-24 NOTE — ED Provider Notes (Signed)
[place] EMERGENCY DEPARTMENT AT [facility] Provider Note   CSN: [id number] Arrival date & time: 04/24/23  0915     History  Chief Complaint  Patient presents with   Generalized Body Aches   Psoriasis    Isabella Powell is a 52 y.o. female.  52 year old female with history of psoriasis presents today for concern of flareup.  She states she has generalized pruritus.  She states she is taking a lotion that was provided by her PCP but it is not helping.  She has an appointment with her provider that manages psoriasis later today.  She states she just needs something in the meantime to provide some relief.  Denies any fever, joint pain, or other complaints.  The history is provided by the patient. No language interpreter was used.       Home Medications Prior to Admission medications   Medication Sig Start Date End Date Taking? Authorizing Provider  hydrocortisone cream 1 % Apply to affected area 2 times daily 04/24/23  Yes Damien Batty, PA-C  Aspirin-Caffeine (BACK & BODY EXTRA STRENGTH PO) Take 2 tablets by mouth every 6 (six) hours as needed (pain).    [provider]  Doxylamine-DM & DM (VICKS DAYQUIL/NYQUIL COUGH) 6.25-15 & 15 MG/15ML LQPK Take 15 mLs by mouth every 6 (six) hours as needed (pain).    [provider]  Ibuprofen (ADVIL PO) Take by mouth.    [provider]  ondansetron (ZOFRAN-ODT) 4 MG disintegrating tablet Take 1 tablet (4 mg total) by mouth every 8 (eight) hours as needed for nausea or vomiting. 12/15/22   Honor Loh M, PA-C  triamcinolone cream (KENALOG) 0.1 % Apply 1 Application topically 2 (two) times daily. 12/15/22   Teressa Lower, PA-C      Allergies    Patient has no known allergies.    Review of Systems   Review of Systems  Constitutional:  Negative for chills and fever.  Respiratory:  Negative for shortness of breath.   Musculoskeletal:  Negative for arthralgias.  Skin:  Positive for rash.  All  other systems reviewed and are negative.   Physical Exam Updated Vital Signs BP 115/84 (BP Location: Left Arm)   Pulse 78   Temp 98.1 F (36.7 C) (Oral)   Resp 16   Ht 5\' 7"  (1.702 m)   Wt 54.4 kg   LMP 12/22/2015   SpO2 98%   BMI 18.78 kg/m  Physical Exam Vitals and nursing note reviewed.  Constitutional:      General: She is not in acute distress.    Appearance: Normal appearance. She is not ill-appearing.  HENT:     Head: Normocephalic and atraumatic.     Nose: Nose normal.  Eyes:     Conjunctiva/sclera: Conjunctivae normal.  Cardiovascular:     Rate and Rhythm: Normal rate.  Pulmonary:     Effort: Pulmonary effort is normal. No respiratory distress.  Musculoskeletal:        General: No deformity.  Skin:    Findings: Rash (Generalized rash and skin breakdown.) present.  Neurological:     Mental Status: She is alert.     ED Results / Procedures / Treatments   Labs (all labs ordered are listed, but only abnormal results are displayed) Labs Reviewed - No data to display  EKG None  Radiology No results found.  Procedures Procedures    Medications Ordered in ED Medications  hydrOXYzine (ATARAX) tablet 25 mg (has no administration in time  range)    ED Course/ Medical Decision Making/ A&P                                 Medical Decision Making Risk Prescription drug management.   52 year old female presents today for concern of generalized rash.  Reports history of psoriasis.  He is requesting a cream to help with the pruritus.  Will give dose of hydroxyzine in the emergency department.  She states she has an appointment with the provider that manages her psoriasis later today.  Will prescribe hydrocortisone cream.  She is in agreement with this.  Return precautions discussed.  She is stable for discharge.  Discharged in stable condition.   Final Clinical Impression(s) / ED Diagnoses Final diagnoses:  Psoriasis    Rx / DC Orders ED Discharge  Orders          Ordered    hydrocortisone cream 1 %        04/24/23 1320              Marita Kansas, PA-C 04/24/23 1324    Bethann Berkshire, MD 04/25/23 1222

## 2023-04-24 NOTE — Discharge Instructions (Signed)
He received a dose of hydroxyzine in the emergency department.  I have sent hydrocortisone cream into the pharmacy for you.  Please follow-up with your provider that manages your psoriasis.  You state you have an appointment scheduled today.  For any concerning symptoms return to the emergency room.

## 2023-04-24 NOTE — ED Notes (Signed)
Patient left before getting her meds, vitals , and discharge papers

## 2023-04-24 NOTE — ED Triage Notes (Signed)
Pt arrived reporting pain all over. States she has psoriasis flare. Endorses having an appt this morning for it but could not make it to [place]. States 10/10 body pain. No other symptoms reported.

## 2023-12-27 ENCOUNTER — Emergency Department (HOSPITAL_COMMUNITY)
Admission: EM | Admit: 2023-12-27 | Discharge: 2023-12-27 | Disposition: A | Discharge status: Home / Self Care | Attending: Emergency Medicine | Admitting: Emergency Medicine

## 2023-12-27 ENCOUNTER — Emergency Department (HOSPITAL_COMMUNITY)

## 2023-12-27 ENCOUNTER — Encounter (HOSPITAL_COMMUNITY): Payer: Self-pay

## 2023-12-27 DIAGNOSIS — S0101XA Laceration without foreign body of scalp, initial encounter: Secondary | ICD-10-CM | POA: Insufficient documentation

## 2023-12-27 DIAGNOSIS — Z7982 Long term (current) use of aspirin: Secondary | ICD-10-CM | POA: Diagnosis not present

## 2023-12-27 DIAGNOSIS — S0990XA Unspecified injury of head, initial encounter: Secondary | ICD-10-CM | POA: Diagnosis present

## 2023-12-27 MED ORDER — LIDOCAINE-EPINEPHRINE-TETRACAINE (LET) TOPICAL GEL
3.0000 mL | Freq: Once | TOPICAL | Status: AC
  Administered 2023-12-27: 3 mL via TOPICAL
  Filled 2023-12-27: qty 3

## 2023-12-27 MED ORDER — ACETAMINOPHEN 325 MG PO TABS
650.0000 mg | ORAL_TABLET | Freq: Once | ORAL | Status: AC
  Administered 2023-12-27: 650 mg via ORAL
  Filled 2023-12-27: qty 2

## 2023-12-27 NOTE — Discharge Instructions (Addendum)
 Please return to ED, urgent care or PCP in 10 days for removal of staple.  See attached guide concerning nonsutured laceration care.
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
## 2023-12-27 NOTE — ED Provider Notes (Signed)
 Patient's care assumed at 6:30 AM.  Patient has a laceration to her scalp she is pending CT of her head.  CT scan returned shows no acute findings.  Patient is discharged in stable condition with follow-up instructions.   Sandi Crosby, PA-C 12/27/23 0717    Kingsley, Victoria K, DO 12/27/23 [phone]
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
## 2024-08-01 ENCOUNTER — Other Ambulatory Visit (HOSPITAL_COMMUNITY): Payer: Self-pay
